# Patient Record
Sex: Female | Born: 1995 | Race: White | Hispanic: No | Marital: Married | Smoking: Never smoker
Health system: Southern US, Community
[De-identification: ages and names within clinical notes are randomized; demographics above are authoritative.]

## PROBLEM LIST (undated history)

## (undated) DIAGNOSIS — N83201 Unspecified ovarian cyst, right side: Secondary | ICD-10-CM

## (undated) DIAGNOSIS — F32A Depression, unspecified: Secondary | ICD-10-CM

## (undated) DIAGNOSIS — J45909 Unspecified asthma, uncomplicated: Secondary | ICD-10-CM

## (undated) DIAGNOSIS — N390 Urinary tract infection, site not specified: Secondary | ICD-10-CM

## (undated) DIAGNOSIS — F419 Anxiety disorder, unspecified: Secondary | ICD-10-CM

## (undated) DIAGNOSIS — F410 Panic disorder [episodic paroxysmal anxiety] without agoraphobia: Secondary | ICD-10-CM

## (undated) DIAGNOSIS — O139 Gestational [pregnancy-induced] hypertension without significant proteinuria, unspecified trimester: Secondary | ICD-10-CM

## (undated) HISTORY — PX: TONSILLECTOMY: SUR1361

## (undated) HISTORY — DX: Unspecified asthma, uncomplicated: J45.909

## (undated) HISTORY — DX: Panic disorder (episodic paroxysmal anxiety): F41.0

## (undated) HISTORY — DX: Anxiety disorder, unspecified: F41.9

## (undated) HISTORY — DX: Gestational (pregnancy-induced) hypertension without significant proteinuria, unspecified trimester: O13.9

---
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
## 2013-01-03 ENCOUNTER — Ambulatory Visit (INDEPENDENT_AMBULATORY_CARE_PROVIDER_SITE_OTHER): Payer: BC Managed Care – PPO | Admitting: Psychiatry

## 2013-01-03 DIAGNOSIS — R4589 Other symptoms and signs involving emotional state: Secondary | ICD-10-CM

## 2013-01-03 DIAGNOSIS — F410 Panic disorder [episodic paroxysmal anxiety] without agoraphobia: Secondary | ICD-10-CM

## 2013-01-04 ENCOUNTER — Encounter (HOSPITAL_COMMUNITY): Payer: Self-pay | Admitting: Psychiatry

## 2013-01-04 NOTE — Progress Notes (Signed)
   THERAPIST PROGRESS NOTE  Session Time: 1:30-2:20  Participation Level: Active  Behavioral Response: CasualAlertEuthymic  Type of Therapy: Individual Therapy  Treatment Goals addressed: Anxiety, stress management, emotion regulation  Interventions: CBT  Summary: Damara Klunder is a 17 y.o. female who presents with anxiety.   Suicidal/Homicidal: Nowithout intent/plan  Therapist Response: Pt. Presents as restless, fidgety, intermittently tearful.  Pt. Reports experience on remeron is that her panic attacks are more manageable; she continues to experience panic attacks but with less severity such that they do not ruin her day and she is able to participate in planned activities. Pt. Reports that she has forgotten to take remeron a few times and notices that she feels better when she does not take the medication, experienced more motivation to get dressed, look for a job, and participate in the day. Session focused on feelings of body insecurity, lack of trust in relationship with boyfriend.  Plan: Return again in 2 weeks.  Diagnosis: Axis I: Anxiety Disorder NOS    Axis II: No diagnosis    Wynonia Musty 01/04/2013

## 2013-01-06 ENCOUNTER — Telehealth (HOSPITAL_COMMUNITY): Payer: Self-pay | Admitting: Psychiatry

## 2013-01-06 ENCOUNTER — Telehealth (HOSPITAL_COMMUNITY): Payer: Self-pay

## 2013-01-06 NOTE — Telephone Encounter (Signed)
Mother left VM:Pt having problems.Does not want to continue to take Remeron.Went up to 15 mg on 9/7 and it seems to be helping. Appt with Dr. Lucianne Muss next Tuesday. Contacted mother.She states Jlee is not staying full time at home, but when she was home last night, she reported not sleeping well previous night.Mother states she appears agitated and tearful at times. Mother concerned about what pt reports discussed in appt with therapist. Advised mother to speak with therapist to clarify. She states she will call her. Mother asked if there was a way to know if the medicine is helping. Mother states Sanjuana says that even though medicine is helping, she does not want to stay on it. Mother states pt has been on Remeron 4 weeks, 2 weeks at 15 mg. Advised mother that full effect of medication has not been reached at this time and that since pt reports some improvement, that is indication medicine is helping. Advised mother to encourage daughter to wait to stop medicine until she can discuss it with Dr. Lucianne Muss on Tuesday.Mother states she will do this and remind her it is helping.

## 2013-01-10 ENCOUNTER — Ambulatory Visit (INDEPENDENT_AMBULATORY_CARE_PROVIDER_SITE_OTHER): Payer: BC Managed Care – PPO | Admitting: Psychiatry

## 2013-01-10 ENCOUNTER — Telehealth (HOSPITAL_COMMUNITY): Payer: Self-pay | Admitting: Psychiatry

## 2013-01-10 ENCOUNTER — Encounter (HOSPITAL_COMMUNITY): Payer: Self-pay | Admitting: Psychiatry

## 2013-01-10 DIAGNOSIS — F329 Major depressive disorder, single episode, unspecified: Secondary | ICD-10-CM

## 2013-01-10 DIAGNOSIS — F341 Dysthymic disorder: Secondary | ICD-10-CM

## 2013-01-10 DIAGNOSIS — F410 Panic disorder [episodic paroxysmal anxiety] without agoraphobia: Secondary | ICD-10-CM

## 2013-01-10 NOTE — Progress Notes (Signed)
Patient ID: Tiffany Hester, female   DOB: [birth year], 17 y.o.   MRN: [id number]  Session Time: 1:30-2:20   Participation Level: Active   Behavioral Response: CasualAlertDepressed and restless   Type of Therapy: Individual Therapy   Treatment Goals addressed: Anxiety, stress management, emotion regulation   Interventions: CBT   Summary: Tiffany Hester is a 17 y.o. female who presents with anxiety.   Suicidal/Homicidal: Nowithout intent/plan   Therapist Response: Pt. Presents as restless, tearful, suicidal ideation. Pt. Stated "I want to kill myself". Pt. Reports that she had suicide plan with razor blades two days ago and that her boyfriend stopped her. Pt. Maintains focus on boyfriend's past infidelity and pornography addiction and belief that she can control his behavior and feelings for her by monitoring his instagram.  Pt. States that "he makes me feel dirty and I am not good enough" because he looks at other girls and has pictures of girls on his instagram. Pt. Continues to report belief that remeron is contributing to her anxiety. Pt. Refused to work on breathing as relaxation and emotion regulation strategy. Encouraged Pt. To limit her use of social media. Consulted with Dr. Lucianne Muss regarding contracting Pt.'s safety and informed Pt. That her mother mother would be called. I called Pt.'s mother at work and agreed on plan for Pt.'s boyfriend's father to meet her in the office and take her home. Pt. Stated plan to go home and work on job applications and return to appointment with Dr. Lucianne Muss on 9/30.  Plan: Return again in 2 weeks.   Diagnosis: Axis I: Depression with anxiety   Axis II: No diagnosis   Tiffany Hester  01/10/2013

## 2013-01-11 ENCOUNTER — Ambulatory Visit (INDEPENDENT_AMBULATORY_CARE_PROVIDER_SITE_OTHER): Payer: BC Managed Care – PPO | Admitting: Psychiatry

## 2013-01-11 VITALS — BP 113/68 | HR 68 | Ht 69.29 in | Wt 123.0 lb

## 2013-01-11 DIAGNOSIS — F410 Panic disorder [episodic paroxysmal anxiety] without agoraphobia: Secondary | ICD-10-CM

## 2013-01-11 DIAGNOSIS — F329 Major depressive disorder, single episode, unspecified: Secondary | ICD-10-CM

## 2013-01-11 MED ORDER — MIRTAZAPINE 15 MG PO TABS: ORAL_TABLET | ORAL | Status: DC

## 2013-01-11 MED ORDER — QUETIAPINE FUMARATE 25 MG PO TABS: ORAL_TABLET | ORAL | Status: DC

## 2013-01-13 ENCOUNTER — Encounter (HOSPITAL_COMMUNITY): Payer: Self-pay | Admitting: Psychiatry

## 2013-01-13 ENCOUNTER — Telehealth (HOSPITAL_COMMUNITY): Payer: Self-pay | Admitting: Psychiatry

## 2013-01-13 ENCOUNTER — Telehealth (HOSPITAL_COMMUNITY): Payer: Self-pay | Admitting: *Deleted

## 2013-01-13 NOTE — Progress Notes (Signed)
Patient ID: Porter Nakama, female   DOB: 02-Aug-1995, 17 y.o.   MRN: [id number]  Psychiatric Assessment Adult  Patient Identification:  Bijou Easler Date of Evaluation:  01/13/2013 Chief Complaint: I am having panic-like feelings and because of that I cannot attend college History of Chief Complaint:   Chief Complaint  Patient presents with  . Anxiety  . Depression  . Follow-up   Anxiety Symptoms include depressed mood, excessive worry and nervous/anxious behavior. Patient reports no confusion, decreased concentration or suicidal ideas.     Patient is an 17 year old female diagnosed with major depressive disorder and generalized anxiety disorder who presents today for a followup visit. Patient reports that the depression has worsened on the Remeron as she's very tired, feels overwhelmed. She does acknowledge that she has some stresses which is a relationship with her boyfriend. She feels that patient is less type, mood would be better. She adds that even though she had suicidal thoughts, she is no plans of hurting herself or killing herself. She reports that she's not with mom since her appointment with a therapist yesterday and is doing better today.On a scale of 0-10, with 0 being no symptoms and 10 being the worst, patient reports her depression as 7/10 and her anxiety is 5/10.   Mom adds that they're monitoring the patient closely, providing her support. She understands that patient wants independence but feels that the patient seems to get overwhelmed easily. She states that the family is going to vacation to [place] and that this will help improve patient's mood. She does want patient's medication to be changed at the Remeron seems to make patient much more tired, irritable and overwhelmed. Review of Systems  Constitutional: Negative.   HENT: Negative.   Eyes: Negative.   Respiratory: Negative.   Cardiovascular: Negative.   Gastrointestinal: Negative.   Endocrine: Negative.    Genitourinary: Negative.   Musculoskeletal: Negative.   Skin: Negative.   Allergic/Immunologic: Negative.   Neurological: Negative.   Hematological: Negative.   Psychiatric/Behavioral: Positive for sleep disturbance and dysphoric mood. Negative for suicidal ideas, hallucinations, behavioral problems, confusion, self-injury, decreased concentration and agitation. The patient is nervous/anxious. The patient is not hyperactive.    Physical Exam Blood pressure 113/68, pulse 68, height 5' 9.29" (1.76 m), weight 123 lb (55.792 kg).    Past Psychiatric History: Diagnosis: None  Hospitalizations: None  Outpatient care: none  Substance Abuse Care: none   Homicidal ideation/attempt: none  Suicidal Attempts: none  Violent Behaviors: none   Past Medical History:   Past Medical History  Diagnosis Date  . Anxiety   . Asthma   . Panic attack 12/11/2012    Allergies:  No Known Allergies Current Medications:  Current Outpatient Prescriptions  Medication Sig Dispense Refill  . JUNEL FE 1/20 1-20 MG-MCG tablet       . mirtazapine (REMERON) 15 MG tablet PO 1/2 QHS for 2 days  1 tablet  0  . QUEtiapine (SEROQUEL) 25 MG tablet 1 at bedtime starting October 2nd. For 1 week and then 2 QHS  60 tablet  1   No current facility-administered medications for this visit.     Family History:   Family History  Problem Relation Age of Onset  . ADD / ADHD Brother   . Alcohol abuse Paternal Uncle     Mental Status Examination/Evaluation: Objective:  Appearance: Casual  Eye Contact::  Fair  Speech:  Clear and Coherent and low in volume  Volume:  Normal  Mood:  Sad, anxious  Affect:  Congruent, Depressed and Tearful  Thought Process:  Goal Directed and Intact  Orientation:  Full (Time, Place, and Person)  Thought Content:  Rumination  Suicidal Thoughts:  Yes.  without intent/plan  Homicidal Thoughts:  No  Judgement:  Impaired  Insight:  Shallow  Psychomotor Activity:  Mannerisms  Akathisia:   NA  Handed:  Right  AIMS (if indicated):  N/A  Assets:  Desire for Improvement Housing Physical Health Social Support Transportation     Assessment:  Axis I: Major Depression, single episode, Panic Disorder and R/O Asperger's Disorder  AXIS I Major Depression, single episode and Panic Disorder  AXIS II Cluster C Traits  AXIS III Past Medical History  Diagnosis Date  . Anxiety   . Asthma   . Panic attack 12/11/2012     AXIS IV educational problems and problems related to social environment  AXIS V 51-60 moderate symptoms   Treatment Plan/Recommendations:  Plan of Care: To decrease Remeron to 15 mg half a pill for 2 days and then discontinue as it is making patient tired and overwhelmed To start Seroquel from October 2 at 25 mg one at bedtime for one week and then increase to 2 at bedtime. The risks and benefits were discussed with patient and mom and they were agreeable with this plan   Laboratory:   none at this time  Psychotherapy: To continue seeing Victorino Dike for individual counseling to help with anxiety and depression    Routine PRN Medications:  No  Consultations:  Patient has an appointment at [facility] to have an evaluation.  Safety Concerns:  None at this as patient  Reports at times having some suicidal thoughts but denies having any plans or any intentions of killing herself   Other:  Call as necessary Follow up in 4 weeks  This was a appointment with moderate complexity requiring input from the patient and mom, coming up with a safety plan, addressing medication side effects and discussing this other medication treatment options along with patient's struggle with transition into adulthood  Nelly Rout, MD 10/2/20142:12 PM

## 2013-01-13 NOTE — Telephone Encounter (Signed)
Mother requests call from therapist

## 2013-01-24 ENCOUNTER — Telehealth (HOSPITAL_COMMUNITY): Payer: Self-pay | Admitting: *Deleted

## 2013-01-24 NOTE — Telephone Encounter (Signed)
Mother called with concerns about Seroquel. Pt told her mother that she started having "muscle twitching". Mother states that she did not increase her dosage as prescribed due to the twitching but over the weekend and would be taking 1/2 dosage. Wants Dr Lucianne Muss to be aware and advise them on what do do.

## 2013-01-24 NOTE — Telephone Encounter (Signed)
Called mother back per Dr Lucianne Muss request, explained that Tiffany Hester should come by office to have nurse evaluate side effects/muscle twitching. Mother stated that it's more joint pain and sharp muscle pains. She states patient is not doing well on medication and that she (pt) does not like the Seroquel and has anxiety about it. She is asking if she should take half dosage or whole dosage. Pt continues to have panic attacks. Wondering if she may need to see the Dr.

## 2013-01-27 ENCOUNTER — Ambulatory Visit (INDEPENDENT_AMBULATORY_CARE_PROVIDER_SITE_OTHER): Payer: BC Managed Care – PPO | Admitting: Psychiatry

## 2013-01-27 ENCOUNTER — Other Ambulatory Visit (HOSPITAL_COMMUNITY): Payer: Self-pay | Admitting: Psychiatry

## 2013-01-27 ENCOUNTER — Encounter (HOSPITAL_COMMUNITY): Payer: Self-pay | Admitting: Psychiatry

## 2013-01-27 ENCOUNTER — Telehealth (HOSPITAL_COMMUNITY): Payer: Self-pay | Admitting: *Deleted

## 2013-01-27 DIAGNOSIS — F329 Major depressive disorder, single episode, unspecified: Secondary | ICD-10-CM

## 2013-01-27 DIAGNOSIS — G249 Dystonia, unspecified: Secondary | ICD-10-CM

## 2013-01-27 DIAGNOSIS — F341 Dysthymic disorder: Secondary | ICD-10-CM

## 2013-01-27 MED ORDER — BENZTROPINE MESYLATE 1 MG PO TABS: 1.0000 mg | ORAL_TABLET | Freq: Every day | ORAL | Status: DC

## 2013-01-27 NOTE — Telephone Encounter (Signed)
Added Cogentin 1 MG to help with EPS
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
## 2013-01-27 NOTE — Telephone Encounter (Signed)
[id number] having medication problems.Gave info to other nurse.Should be in chart.Wants to know what to do about it. Information in VM given to Dr. Lucianne Muss. Dr.Kumar states she will try to see pt this afternoon when pt is here for therapist appt.

## 2013-02-01 ENCOUNTER — Encounter (HOSPITAL_COMMUNITY): Payer: Self-pay | Admitting: Psychiatry

## 2013-02-01 ENCOUNTER — Ambulatory Visit (INDEPENDENT_AMBULATORY_CARE_PROVIDER_SITE_OTHER): Payer: BC Managed Care – PPO | Admitting: Psychiatry

## 2013-02-01 VITALS — BP 114/80 | Ht 69.0 in | Wt 116.2 lb

## 2013-02-01 DIAGNOSIS — F329 Major depressive disorder, single episode, unspecified: Secondary | ICD-10-CM

## 2013-02-01 DIAGNOSIS — G249 Dystonia, unspecified: Secondary | ICD-10-CM

## 2013-02-01 DIAGNOSIS — F410 Panic disorder [episodic paroxysmal anxiety] without agoraphobia: Secondary | ICD-10-CM

## 2013-02-01 MED ORDER — QUETIAPINE FUMARATE 100 MG PO TABS: 100.0000 mg | ORAL_TABLET | Freq: Every day | ORAL | Status: DC

## 2013-02-03 NOTE — Progress Notes (Signed)
Patient ID: Tiffany Hester, female   DOB: Feb 22, 1996, 17 y.o.   MRN: [id number] Medication management followup  [name] Date of Evaluation:  02/03/2013 Chief Complaint: I am doing much better with my mood and anxiety. The Seroquel has helped greatly  Anxiety Presents for follow-up visit. The problem has been gradually improving. Symptoms include excessive worry. Patient reports no chest pain, confusion, decreased concentration, depressed mood, hyperventilation, irritability, nervous/anxious behavior or suicidal ideas. Symptoms occur occasionally. The severity of symptoms is mild. The symptoms are aggravated by family issues and specific phobias. The quality of sleep is fair. Nighttime awakenings: none.   There are no known risk factors.  Her past medical history is significant for depression. The treatment provided significant relief. Compliance with prior treatments has been good. Compliance with medications is 76-100%.   Patient is regards to depression, on a scale of 0-10,with 0 being no symptoms and 10 being the worst, reports that her mood is currently a 3/10 and on the same scale her anxiety is a 4/10.  Patient reports that the Seroquel has helped her greatly both with anxiety and depression. She reports that she's sleeping better. She adds that she is okay with increasing the medication as she has found it to be helpful.  In regards to side effects, patient reports that she initially had some muscle twitching but with the Cogentin being added she's not had any problems. She denies any side effects at this visit, any thoughts of ending her life, hurting herself, any self mutilating behaviors, any other complaints.  Review of Systems  Constitutional: Negative.  Negative for irritability.  HENT: Negative.   Eyes: Negative.   Respiratory: Negative.   Cardiovascular: Negative.  Negative for chest pain.  Gastrointestinal: Negative.   Endocrine: Negative.    Genitourinary: Negative.   Musculoskeletal: Negative.   Skin: Negative.   Allergic/Immunologic: Negative.   Neurological: Negative.   Hematological: Negative.   Psychiatric/Behavioral: Negative for suicidal ideas, hallucinations, behavioral problems, confusion, sleep disturbance, self-injury, dysphoric mood, decreased concentration and agitation. The patient is not nervous/anxious and is not hyperactive.    Physical Exam Blood pressure 114/80, height 5\' 9"  (1.753 m), weight 116 lb 3.2 oz (52.708 kg).    Past Medical History:   Past Medical History  Diagnosis Date  . Anxiety   . Asthma   . Panic attack 12/11/2012    Allergies:  No Known Allergies Current Medications:  Current Outpatient Prescriptions  Medication Sig Dispense Refill  . benztropine (COGENTIN) 1 MG tablet Take 1 tablet (1 mg total) by mouth at bedtime.  30 tablet  2  . JUNEL FE 1/20 1-20 MG-MCG tablet       . QUEtiapine (SEROQUEL) 100 MG tablet Take 1 tablet (100 mg total) by mouth at bedtime.  30 tablet  2   No current facility-administered medications for this visit.     Family History:   Family History  Problem Relation Age of Onset  . ADD / ADHD Brother   . Alcohol abuse Paternal Uncle     Mental Status Examination/Evaluation: Objective:  Appearance: Casual  Eye Contact::  Fair  Speech:  Clear and Coherent  Volume:  Normal  Mood:  Sad, anxious  Affect:  Appropriate, Congruent and Full Range  Thought Process:  Goal Directed and Intact  Orientation:  Full (Time, Place, and Person)  Thought Content:  Rumination  Suicidal Thoughts:  No  Homicidal Thoughts:  No  Judgement:  Impaired  Insight:  Shallow  Psychomotor Activity:  Normal  Akathisia:  NA  Handed:  Right  AIMS (if indicated):  N/A  Assets:  Desire for Improvement Housing Physical Health Social Support Transportation     Assessment:  Axis I: Major Depression, single episode, Panic Disorder and R/O Asperger's Disorder  AXIS I  Major Depression, single episode and Panic Disorder  AXIS II Cluster C Traits  AXIS III Past Medical History  Diagnosis Date  . Anxiety   . Asthma   . Panic attack 12/11/2012     AXIS IV educational problems and problems related to social environment  AXIS V 61-70 mild symptoms   Treatment Plan/Recommendations:  Plan of Care: To increase Seroquel 100 mg at bedtime for depression and mood stabilization To continue Cogentin 1 mg daily for EPS  Laboratory:   none at this time  Psychotherapy: To continue seeing Victorino Dike for individual counseling to help with anxiety and depression    Routine PRN Medications:  No  Consultations:  Patient has an appointment at [facility] to have an evaluation.  Safety Concerns:  None at this as patient    Other:  Call as necessary Follow up in 4 weeks  This was a appointment of low complexity   Nelly Rout, MD 10/23/201410:14 AM

## 2013-02-10 ENCOUNTER — Ambulatory Visit (INDEPENDENT_AMBULATORY_CARE_PROVIDER_SITE_OTHER): Payer: BC Managed Care – PPO | Admitting: Psychiatry

## 2013-02-10 ENCOUNTER — Encounter (HOSPITAL_COMMUNITY): Payer: Self-pay | Admitting: Psychiatry

## 2013-02-10 DIAGNOSIS — F341 Dysthymic disorder: Secondary | ICD-10-CM

## 2013-02-10 DIAGNOSIS — F410 Panic disorder [episodic paroxysmal anxiety] without agoraphobia: Secondary | ICD-10-CM

## 2013-02-10 DIAGNOSIS — F329 Major depressive disorder, single episode, unspecified: Secondary | ICD-10-CM
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
## 2013-02-16 ENCOUNTER — Ambulatory Visit (INDEPENDENT_AMBULATORY_CARE_PROVIDER_SITE_OTHER): Payer: BC Managed Care – PPO | Admitting: Psychiatry

## 2013-02-16 DIAGNOSIS — F329 Major depressive disorder, single episode, unspecified: Secondary | ICD-10-CM

## 2013-02-16 DIAGNOSIS — F341 Dysthymic disorder: Secondary | ICD-10-CM
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
## 2013-02-22 ENCOUNTER — Telehealth (HOSPITAL_COMMUNITY): Payer: Self-pay | Admitting: *Deleted

## 2013-02-22 NOTE — Telephone Encounter (Signed)
Per Dr. Lucianne Muss, will need to be seen to change medication. Front Scientist, product/process development to arrange appt and contact mother.
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
## 2013-02-24 ENCOUNTER — Ambulatory Visit (INDEPENDENT_AMBULATORY_CARE_PROVIDER_SITE_OTHER): Payer: BC Managed Care – PPO | Admitting: Psychiatry

## 2013-02-24 ENCOUNTER — Encounter (HOSPITAL_COMMUNITY): Payer: Self-pay | Admitting: Psychiatry

## 2013-02-24 VITALS — BP 120/80 | HR 77 | Ht 69.0 in | Wt 118.9 lb

## 2013-02-24 DIAGNOSIS — F329 Major depressive disorder, single episode, unspecified: Secondary | ICD-10-CM

## 2013-02-24 DIAGNOSIS — F4310 Post-traumatic stress disorder, unspecified: Secondary | ICD-10-CM

## 2013-02-24 DIAGNOSIS — G249 Dystonia, unspecified: Secondary | ICD-10-CM

## 2013-02-24 MED ORDER — CITALOPRAM HYDROBROMIDE 10 MG PO TABS: 10.0000 mg | ORAL_TABLET | Freq: Every day | ORAL | Status: DC

## 2013-02-24 MED ORDER — HYDROXYZINE HCL 10 MG PO TABS: 10.0000 mg | ORAL_TABLET | Freq: Three times a day (TID) | ORAL | Status: DC | PRN

## 2013-02-24 MED ORDER — QUETIAPINE FUMARATE 100 MG PO TABS: 100.0000 mg | ORAL_TABLET | Freq: Every day | ORAL | Status: DC

## 2013-02-24 MED ORDER — BENZTROPINE MESYLATE 1 MG PO TABS: 1.0000 mg | ORAL_TABLET | Freq: Every day | ORAL | Status: DC
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
## 2013-03-03 ENCOUNTER — Ambulatory Visit (HOSPITAL_COMMUNITY): Payer: BC Managed Care – PPO | Admitting: Psychiatry

## 2013-03-08 ENCOUNTER — Encounter (HOSPITAL_COMMUNITY): Payer: Self-pay | Admitting: Psychiatry

## 2013-03-08 ENCOUNTER — Ambulatory Visit (INDEPENDENT_AMBULATORY_CARE_PROVIDER_SITE_OTHER): Payer: BC Managed Care – PPO | Admitting: Psychiatry

## 2013-03-08 DIAGNOSIS — F329 Major depressive disorder, single episode, unspecified: Secondary | ICD-10-CM
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
## 2013-03-15 ENCOUNTER — Ambulatory Visit (HOSPITAL_COMMUNITY): Payer: BC Managed Care – PPO | Admitting: Psychiatry

## 2013-03-22 ENCOUNTER — Ambulatory Visit (INDEPENDENT_AMBULATORY_CARE_PROVIDER_SITE_OTHER): Payer: BC Managed Care – PPO | Admitting: Psychiatry

## 2013-03-22 ENCOUNTER — Encounter (HOSPITAL_COMMUNITY): Payer: Self-pay | Admitting: Psychiatry

## 2013-03-22 DIAGNOSIS — F329 Major depressive disorder, single episode, unspecified: Secondary | ICD-10-CM
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
## 2013-03-29 ENCOUNTER — Encounter (HOSPITAL_COMMUNITY): Payer: Self-pay | Admitting: Psychiatry

## 2013-03-29 ENCOUNTER — Ambulatory Visit (INDEPENDENT_AMBULATORY_CARE_PROVIDER_SITE_OTHER): Payer: BC Managed Care – PPO | Admitting: Psychiatry

## 2013-03-29 VITALS — BP 122/84 | Ht 69.0 in | Wt 117.8 lb

## 2013-03-29 DIAGNOSIS — F4310 Post-traumatic stress disorder, unspecified: Secondary | ICD-10-CM

## 2013-03-29 DIAGNOSIS — G249 Dystonia, unspecified: Secondary | ICD-10-CM

## 2013-03-29 DIAGNOSIS — F329 Major depressive disorder, single episode, unspecified: Secondary | ICD-10-CM

## 2013-03-29 MED ORDER — CITALOPRAM HYDROBROMIDE 20 MG PO TABS: 20.0000 mg | ORAL_TABLET | Freq: Every day | ORAL | Status: DC

## 2013-03-29 MED ORDER — BENZTROPINE MESYLATE 1 MG PO TABS
1.0000 mg | ORAL_TABLET | Freq: Every day | ORAL | Status: DC
Start: 2013-03-29 — End: 2013-05-03

## 2013-03-29 MED ORDER — QUETIAPINE FUMARATE 100 MG PO TABS: 100.0000 mg | ORAL_TABLET | Freq: Every day | ORAL | Status: DC

## 2013-03-29 NOTE — Progress Notes (Signed)
Patient ID: Tiffany Hester, female   DOB: 1995/10/15, 17 y.o.   MRN: [id number] Medication management followup  [name] Date of Evaluation:  03/29/2013 Chief Complaint: I am doing much better with my mood and anxiety but I still have some anxiety  Anxiety Presents for follow-up visit. The problem has been gradually improving. Symptoms include nervous/anxious behavior. Patient reports no chest pain, compulsions, confusion, decreased concentration, depressed mood, dizziness, dry mouth, excessive worry, feeling of choking, hyperventilation, insomnia, irritability, muscle tension, nausea, palpitations, panic, restlessness, shortness of breath or suicidal ideas. Symptoms occur most days. The severity of symptoms is causing significant distress and mild. The symptoms are aggravated by family issues and specific phobias. The quality of sleep is fair. Nighttime awakenings: one to two, occasional.   There are no known risk factors. Her past medical history is significant for depression. Past treatments include lifestyle changes and counseling (CBT). The treatment provided moderate relief. Compliance with prior treatments has been good. Compliance with medications is 76-100%.   Patient is regards to depression, on a scale of 0-10,with 0 being no symptoms and 10 being the worst, reports that her mood is currently a 2/10 and on the same scale her anxiety is a 5/10.  Patient reports that she is also now looking for a job,is dropping a job application today. She adds that she gets anxious at times, but other than anxiety she is overall doing well.  In regards to side effects, patient reports that she is not having any. She denies any thoughts of ending her life, hurting herself, any self mutilating behaviors, any other complaints.  Review of Systems  Constitutional: Negative.  Negative for irritability.  HENT: Negative.   Eyes: Negative.   Respiratory: Negative.  Negative for  shortness of breath.   Cardiovascular: Negative.  Negative for chest pain and palpitations.  Gastrointestinal: Negative.  Negative for nausea.  Endocrine: Negative.   Genitourinary: Negative.   Musculoskeletal: Negative.   Skin: Negative.   Allergic/Immunologic: Negative.   Neurological: Negative.  Negative for dizziness.  Hematological: Negative.   Psychiatric/Behavioral: Negative for suicidal ideas, hallucinations, behavioral problems, confusion, sleep disturbance, self-injury, dysphoric mood, decreased concentration and agitation. The patient is nervous/anxious. The patient does not have insomnia and is not hyperactive.    Physical Exam Blood pressure 122/84, height 5\' 9"  (1.753 m), weight 117 lb 12.8 oz (53.434 kg).    Past Medical History:   Past Medical History  Diagnosis Date  . Anxiety   . Asthma   . Panic attack 12/11/2012    Allergies:  No Known Allergies Current Medications:  Current Outpatient Prescriptions  Medication Sig Dispense Refill  . benztropine (COGENTIN) 1 MG tablet Take 1 tablet (1 mg total) by mouth at bedtime.  30 tablet  2  . citalopram (CELEXA) 20 MG tablet Take 1 tablet (20 mg total) by mouth daily.  30 tablet  2  . hydrOXYzine (ATARAX/VISTARIL) 10 MG tablet Take 1 tablet (10 mg total) by mouth 3 (three) times daily as needed for anxiety.  90 tablet  1  . JUNEL FE 1/20 1-20 MG-MCG tablet       . QUEtiapine (SEROQUEL) 100 MG tablet Take 1 tablet (100 mg total) by mouth at bedtime.  30 tablet  2   No current facility-administered medications for this visit.     Family History:   Family History  Problem Relation Age of Onset  . ADD / ADHD Brother   . Alcohol abuse Paternal Uncle  Mental Status Examination/Evaluation: Objective:  Appearance: Casual  Eye Contact::  Fair  Speech:  Clear and Coherent  Volume:  Normal  Mood:  Sad, anxious  Affect:  Appropriate, Congruent and Full Range  Thought Process:  Goal Directed and Intact  Orientation:   Full (Time, Place, and Person)  Thought Content:  Rumination  Suicidal Thoughts:  No  Homicidal Thoughts:  No  Judgement:  Impaired  Insight:  Shallow  Psychomotor Activity:  Normal  Akathisia:  NA  Handed:  Right  AIMS (if indicated):  N/A  Assets:  Desire for Improvement Housing Physical Health Social Support Transportation     Assessment:  Axis I: Major Depression, single episode and Post Traumatic Stress Disorder  AXIS I Major Depression, single episode and Post Traumatic Stress Disorder  AXIS II Cluster C Traits  AXIS III Past Medical History  Diagnosis Date  . Anxiety   . Asthma   . Panic attack 12/11/2012     AXIS IV educational problems and problems related to social environment  AXIS V 51-60 moderate symptoms   Treatment Plan/Recommendations:  Plan of Care: Continue Seroquel 100 mg at bedtime for depression and mood stabilization Continue Cogentin 1 mg daily for EPS To  Increase Celexa to 20 mg daily to help with anxiety and depression.  Continue Vistaril 10 mg by mouth 3 times a day when necessary for anxiety/agitation.   Laboratory:   none at this time  Psychotherapy: To continue seeing Victorino Dike for individual counseling to help with anxiety and depression    Routine PRN Medications:  Yes,Vistaril 10 mg 3 times a day when necessary anxiety/agitation  Consultations:  Patient has an appointment at [facility] to have an evaluation.  Safety Concerns:  None at this as patient    Other:  Call as necessary Follow up in 4 weeks   Nelly Rout, MD 12/16/201411:33 AM

## 2013-03-30 ENCOUNTER — Encounter (HOSPITAL_COMMUNITY): Payer: Self-pay | Admitting: Psychiatry

## 2013-03-30 ENCOUNTER — Ambulatory Visit (INDEPENDENT_AMBULATORY_CARE_PROVIDER_SITE_OTHER): Payer: BC Managed Care – PPO | Admitting: Psychiatry

## 2013-03-30 DIAGNOSIS — F410 Panic disorder [episodic paroxysmal anxiety] without agoraphobia: Secondary | ICD-10-CM

## 2013-03-30 DIAGNOSIS — F329 Major depressive disorder, single episode, unspecified: Secondary | ICD-10-CM
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
## 2013-04-05 ENCOUNTER — Ambulatory Visit (HOSPITAL_COMMUNITY): Payer: BC Managed Care – PPO | Admitting: Psychiatry

## 2013-04-12 ENCOUNTER — Ambulatory Visit (INDEPENDENT_AMBULATORY_CARE_PROVIDER_SITE_OTHER): Payer: BC Managed Care – PPO | Admitting: Psychiatry

## 2013-04-12 DIAGNOSIS — F329 Major depressive disorder, single episode, unspecified: Secondary | ICD-10-CM
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
## 2013-04-19 ENCOUNTER — Ambulatory Visit (HOSPITAL_COMMUNITY): Payer: BC Managed Care – PPO | Admitting: Psychiatry

## 2013-04-24 ENCOUNTER — Other Ambulatory Visit (HOSPITAL_COMMUNITY): Payer: Self-pay | Admitting: Psychiatry

## 2013-04-26 ENCOUNTER — Ambulatory Visit (INDEPENDENT_AMBULATORY_CARE_PROVIDER_SITE_OTHER): Payer: BC Managed Care – PPO | Admitting: Psychiatry

## 2013-04-26 DIAGNOSIS — F3289 Other specified depressive episodes: Secondary | ICD-10-CM

## 2013-04-26 DIAGNOSIS — F329 Major depressive disorder, single episode, unspecified: Secondary | ICD-10-CM
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
## 2013-05-03 ENCOUNTER — Other Ambulatory Visit (HOSPITAL_COMMUNITY): Payer: Self-pay | Admitting: Psychiatry

## 2013-05-03 ENCOUNTER — Ambulatory Visit (INDEPENDENT_AMBULATORY_CARE_PROVIDER_SITE_OTHER): Payer: BC Managed Care – PPO | Admitting: Psychiatry

## 2013-05-03 ENCOUNTER — Telehealth (HOSPITAL_COMMUNITY): Payer: Self-pay | Admitting: *Deleted

## 2013-05-03 DIAGNOSIS — F4310 Post-traumatic stress disorder, unspecified: Secondary | ICD-10-CM

## 2013-05-03 DIAGNOSIS — F410 Panic disorder [episodic paroxysmal anxiety] without agoraphobia: Secondary | ICD-10-CM

## 2013-05-03 DIAGNOSIS — F329 Major depressive disorder, single episode, unspecified: Secondary | ICD-10-CM

## 2013-05-03 DIAGNOSIS — G249 Dystonia, unspecified: Secondary | ICD-10-CM

## 2013-05-03 MED ORDER — HYDROXYZINE HCL 25 MG PO TABS: 25.0000 mg | ORAL_TABLET | Freq: Three times a day (TID) | ORAL | Status: DC | PRN

## 2013-05-03 MED ORDER — QUETIAPINE FUMARATE 100 MG PO TABS
100.0000 mg | ORAL_TABLET | Freq: Every day | ORAL | Status: DC
Start: 2013-05-03 — End: 2013-07-07

## 2013-05-03 MED ORDER — BENZTROPINE MESYLATE 1 MG PO TABS: 1.0000 mg | ORAL_TABLET | Freq: Two times a day (BID) | ORAL | Status: DC

## 2013-05-03 MED ORDER — CITALOPRAM HYDROBROMIDE 20 MG PO TABS: 20.0000 mg | ORAL_TABLET | Freq: Every day | ORAL | Status: DC

## 2013-05-03 NOTE — Progress Notes (Signed)
Patient ID: Tiffany Hester, female   DOB: May 16, 1995, 18 y.o.   MRN: [id number]  Session Time: 12:30-1:20   Participation Level: Active   Behavioral Response: CasualAlertDysphoric   Type of Therapy: Individual Therapy   Treatment Goals addressed: emotion regulation   Interventions: CBT, bodyscan and breathwork   Summary: Tiffany Hester is a 18 y.o. female who presents with depression and anxiety.   Suicidal/Homicidal: Nowithout intent/plan   Therapist Response: Pt. Presents with dysphoric mood and anxiety. Pt. Reports that she has moved to [place] with her boyfriend and found an apartment. Pt. Planning to continue with healthcare providers in [place]. Pt. Reports that she did not take cogentin for 2-3 nights this week because prescription ran out. When she restarted cogentin muscles in hands severely constricted and experienced difficulty breathing.  Pt. Reports for the first time that her medications affect her short-term memory and that she has nausea daily. Pt. Reports that she has lost approximately 6 pounds and has poor appetite. Pt. Reports that she is using marijuana daily to assist with relaxation at bedtime and to lessen nausea. Session focused on identifying recent stressors (i.e., moving, concerns about finding a job, fears about insufficient money and food). Pt. Participated in [place] and bodyscan. Recommended that Pt. Use body/scan and/or meditation and breathwork daily to manage stress and assist with management of anxiety and depression.  Plan: Return again in 1-2  weeks. Continue with CBT and interventions designed to assist with self-esteem development and emotion regulation (i.e., breathwork, meditation, and guided visualization).   Diagnosis: Axis I: Depressive Disorder NOS   Axis II: No diagnosis   Tiffany Hester  05/03/2013

## 2013-05-03 NOTE — Telephone Encounter (Signed)
Mother called left message about daughter having trouble walking, arms pulled (contracted) at times, having SOB needing to use her inhaler, stiff neck. Mother states she is on Seroquel and started Celexa in December. She has been nauseated and vomiting daily. Was out of Cogentin for several days but restarted on Sunday 05/01/13. Did not go to ER to be seen. Asked mother about fever or vomiting today and mother does not know as she has not talked to daughter today. She admits her daughter gets really hot when vomiting but hasn't had a fever. Patient has appointment at 12:30 today with Eloise Levels.

## 2013-05-09 ENCOUNTER — Telehealth (HOSPITAL_COMMUNITY): Payer: Self-pay

## 2013-05-09 ENCOUNTER — Ambulatory Visit (HOSPITAL_COMMUNITY): Payer: BC Managed Care – PPO | Admitting: Psychiatry

## 2013-05-10 ENCOUNTER — Ambulatory Visit (HOSPITAL_COMMUNITY): Payer: BC Managed Care – PPO | Admitting: Psychiatry

## 2013-05-18 ENCOUNTER — Telehealth (HOSPITAL_COMMUNITY): Payer: Self-pay | Admitting: *Deleted

## 2013-05-18 NOTE — Telephone Encounter (Signed)
Opened in error

## 2013-05-19 ENCOUNTER — Ambulatory Visit (HOSPITAL_COMMUNITY): Payer: BC Managed Care – PPO | Admitting: Psychiatry

## 2013-05-25 ENCOUNTER — Ambulatory Visit (HOSPITAL_COMMUNITY): Payer: BC Managed Care – PPO | Admitting: Psychiatry

## 2013-07-07 ENCOUNTER — Telehealth (HOSPITAL_COMMUNITY): Payer: Self-pay | Admitting: *Deleted

## 2013-07-07 DIAGNOSIS — F329 Major depressive disorder, single episode, unspecified: Secondary | ICD-10-CM

## 2013-07-07 MED ORDER — QUETIAPINE FUMARATE 100 MG PO TABS: 100.0000 mg | ORAL_TABLET | Freq: Every day | ORAL | Status: DC

## 2013-07-07 NOTE — Telephone Encounter (Signed)
Mother left FI:EPPI 3/31.Will be out of Seroquel 2 days before then.[facility] [place] [place], [phone]. Contacted mother--Advised mother RX for 30 day supply with 2 refills sent to [facility] 05/03/13,should have one last refill.Mother states she talked to them and they said they do not have any more refills.[facility] [place].

## 2013-07-12 ENCOUNTER — Encounter (HOSPITAL_COMMUNITY): Payer: Self-pay | Admitting: Psychiatry

## 2013-07-12 ENCOUNTER — Ambulatory Visit (INDEPENDENT_AMBULATORY_CARE_PROVIDER_SITE_OTHER): Payer: BC Managed Care – PPO | Admitting: Psychiatry

## 2013-07-12 VITALS — BP 122/74 | Ht 69.0 in | Wt 114.6 lb

## 2013-07-12 DIAGNOSIS — F329 Major depressive disorder, single episode, unspecified: Secondary | ICD-10-CM

## 2013-07-12 DIAGNOSIS — F4310 Post-traumatic stress disorder, unspecified: Secondary | ICD-10-CM

## 2013-07-12 MED ORDER — CITALOPRAM HYDROBROMIDE 40 MG PO TABS: 40.0000 mg | ORAL_TABLET | Freq: Every day | ORAL | Status: DC

## 2013-07-12 MED ORDER — HYDROXYZINE HCL 25 MG PO TABS: 25.0000 mg | ORAL_TABLET | Freq: Three times a day (TID) | ORAL | Status: DC | PRN

## 2013-07-12 MED ORDER — QUETIAPINE FUMARATE 100 MG PO TABS: 100.0000 mg | ORAL_TABLET | Freq: Every day | ORAL | Status: DC
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
## 2013-08-05 ENCOUNTER — Other Ambulatory Visit (HOSPITAL_COMMUNITY): Payer: Self-pay | Admitting: Psychiatry

## 2013-08-05 DIAGNOSIS — F329 Major depressive disorder, single episode, unspecified: Secondary | ICD-10-CM

## 2013-09-01 ENCOUNTER — Ambulatory Visit (HOSPITAL_COMMUNITY): Payer: BC Managed Care – PPO | Admitting: Psychiatry

## 2013-09-08 ENCOUNTER — Ambulatory Visit (INDEPENDENT_AMBULATORY_CARE_PROVIDER_SITE_OTHER): Payer: BC Managed Care – PPO | Admitting: Psychiatry

## 2013-09-08 ENCOUNTER — Encounter (HOSPITAL_COMMUNITY): Payer: Self-pay | Admitting: Psychiatry

## 2013-09-08 VITALS — BP 122/78 | Ht 69.0 in | Wt 116.0 lb

## 2013-09-08 DIAGNOSIS — F4310 Post-traumatic stress disorder, unspecified: Secondary | ICD-10-CM

## 2013-09-08 DIAGNOSIS — F329 Major depressive disorder, single episode, unspecified: Secondary | ICD-10-CM

## 2013-09-08 MED ORDER — CITALOPRAM HYDROBROMIDE 40 MG PO TABS: 40.0000 mg | ORAL_TABLET | Freq: Every day | ORAL | Status: DC

## 2013-09-08 MED ORDER — HYDROXYZINE HCL 25 MG PO TABS: 25.0000 mg | ORAL_TABLET | Freq: Three times a day (TID) | ORAL | Status: DC | PRN

## 2013-09-08 MED ORDER — QUETIAPINE FUMARATE 50 MG PO TABS: 50.0000 mg | ORAL_TABLET | Freq: Every day | ORAL | Status: DC
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
## 2013-09-09 ENCOUNTER — Ambulatory Visit (INDEPENDENT_AMBULATORY_CARE_PROVIDER_SITE_OTHER): Payer: BC Managed Care – PPO | Admitting: Psychiatry

## 2013-09-09 DIAGNOSIS — F329 Major depressive disorder, single episode, unspecified: Secondary | ICD-10-CM

## 2013-09-09 NOTE — Progress Notes (Signed)
   THERAPIST PROGRESS NOTE  Session Time: 1:00-2:00  Participation Level: Active   Behavioral Response: CasualAlertEuthymic  Type of Therapy: Individual Therapy   Treatment Goals addressed: emotion regulation   Interventions: CBT, supportive, strengthbased  Summary: Chihiro Frey is a 18 y.o. female who presents with depression and anxiety.   Suicidal/Homicidal: Nowithout intent/plan   Therapist Response: Pt. Presents with euthymic mood, makes eye contact, smiles and laughs appropriately. Pt. Reports that she has moved to [place] with her boyfriend. Pt. 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 Pt. Reports that she and boyfriend are doing well, likes her apartment, and has two cats Pt. Reports that her depression and anxiety have been manageable although she continues to have periods when she feels "skeptical" about her boyfriends love for her and is fearful that he might cheat on her. Pt. Was encouraged to find exceptions to her thoughts about her boyfriend's behavior i.e., he is with me now, he is not cheating on me now, right now I feel loved and protected. Pt. Shared her artwork. Session focused on challenging cognitive distortions about her relationship, finding ways to self-soothe when anxious (i.e., blanket with mother's perfume to wrap around her), and focusing on her sketches.  Plan: Return again in 1-2 weeks. Continue with CBT and interventions designed to assist with self-esteem development and emotion regulation (i.e., breathwork, meditation, and guided visualization).   Diagnosis: Axis I: Depressive Disorder NOS   Axis II: No diagnosis     Renford Dills 09/09/2013

## 2013-09-16 ENCOUNTER — Telehealth (HOSPITAL_COMMUNITY): Payer: Self-pay

## 2013-09-19 NOTE — Telephone Encounter (Signed)
Can increase back to seroquel to 100 MG

## 2013-10-01 ENCOUNTER — Other Ambulatory Visit (HOSPITAL_COMMUNITY): Payer: Self-pay | Admitting: Psychiatry

## 2013-10-07 ENCOUNTER — Ambulatory Visit (HOSPITAL_COMMUNITY): Payer: BC Managed Care – PPO | Admitting: Psychiatry

## 2013-10-18 ENCOUNTER — Ambulatory Visit (HOSPITAL_COMMUNITY): Payer: BC Managed Care – PPO | Admitting: Psychiatry

## 2013-10-18 ENCOUNTER — Encounter (HOSPITAL_COMMUNITY): Payer: Self-pay | Admitting: Psychiatry

## 2013-10-18 ENCOUNTER — Ambulatory Visit (INDEPENDENT_AMBULATORY_CARE_PROVIDER_SITE_OTHER): Payer: BC Managed Care – PPO | Admitting: Psychiatry

## 2013-10-18 VITALS — BP 120/68 | Ht 69.0 in | Wt 116.6 lb

## 2013-10-18 DIAGNOSIS — F329 Major depressive disorder, single episode, unspecified: Secondary | ICD-10-CM

## 2013-10-18 DIAGNOSIS — F4310 Post-traumatic stress disorder, unspecified: Secondary | ICD-10-CM

## 2013-10-18 DIAGNOSIS — F321 Major depressive disorder, single episode, moderate: Secondary | ICD-10-CM

## 2013-10-18 MED ORDER — CITALOPRAM HYDROBROMIDE 40 MG PO TABS: 40.0000 mg | ORAL_TABLET | Freq: Every day | ORAL | Status: DC

## 2013-10-18 MED ORDER — QUETIAPINE FUMARATE 100 MG PO TABS: 100.0000 mg | ORAL_TABLET | Freq: Every day | ORAL | Status: DC

## 2013-10-18 NOTE — Progress Notes (Signed)
Patient ID: Tiffany Hester, female   DOB: 1995/08/18, 18 y.o.   MRN: [id number] Medication management followup  [name] Date of Evaluation:  10/18/2013 Chief Complaint: I still struggle with my mood and anxiety at times especially when I'm overwhelmed. I quit my job as I felt my boss was rude and that stressed me   History of present illness: Patient is a 18 year old female diagnosed with major depressive disorder and PTSD who presents today for a followup visit.  Patient states that she quit her job as it was too stressful for her, adds that she has an interview for another job on Monday and has put an other applications. She states that she found a job overwhelming as her boss would yell at times. She adds that because of that she was having suicidal thoughts but since she's quit her job she is doing better. She states that her mood is stable at this time as there is no stressor. She agrees with mom that she does better when she sees a therapist and adds that she will open her therapist in [place] a long drive to come to [place] to see a therapist on a weekly basis.  On a scale of 0-10 with 0 being no symptoms in 10 being the worst, patient reports her depression a 2/10 and her anxiety on the same scale as a 4/10. She states that the Seroquel makes her tired but mom reports that when the Seroquel was lowered, she was struggling with her mood. Patient agrees with this. Patient states that part of the problem is that she's not really motivated to get out and do anything as she's currently does not have a job. Mom agrees that the patient's motivation was better when she was working. They both deny any aggravating or relieving factors at this visit  Patient denies any other side effects. She denies any thoughts of ending her life, hurting herself, any self mutilating behaviors, any other complaints.  Review of Systems  Constitutional: Negative.  Negative for fever  and malaise/fatigue.  HENT: Negative.  Negative for congestion and sore throat.   Eyes: Negative.  Negative for blurred vision.  Respiratory: Negative.  Negative for cough and wheezing.   Cardiovascular: Negative.  Negative for palpitations.  Gastrointestinal: Negative for heartburn, nausea, vomiting, diarrhea and constipation.  Genitourinary: Negative.  Negative for dysuria.  Musculoskeletal: Negative.  Negative for myalgias.  Skin: Negative.   Neurological: Negative.  Negative for dizziness, seizures, loss of consciousness, weakness and headaches.  Endo/Heme/Allergies: Negative.  Negative for environmental allergies.  Psychiatric/Behavioral: Negative for depression, suicidal ideas, hallucinations, memory loss and substance abuse. The patient is nervous/anxious. The patient does not have insomnia.     Physical Exam Blood pressure 120/68, height 5\' 9"  (1.753 m), weight 116 lb 9.6 oz (52.889 kg).    Past Medical History:   Past Medical History  Diagnosis Date  . Anxiety   . Asthma   . Panic attack 12/11/2012    Allergies:  No Known Allergies Current Medications:  Current Outpatient Prescriptions  Medication Sig Dispense Refill  . benztropine (COGENTIN) 1 MG tablet TAKE 1 TABLET BY MOUTH TWICE A DAY  60 tablet  1  . citalopram (CELEXA) 40 MG tablet Take 1 tablet (40 mg total) by mouth daily.  30 tablet  2  . hydrOXYzine (ATARAX/VISTARIL) 25 MG tablet Take 1 tablet (25 mg total) by mouth 3 (three) times daily as needed for anxiety.  90 tablet  2  .  JUNEL FE 1/20 1-20 MG-MCG tablet       . QUEtiapine (SEROQUEL) 100 MG tablet Take 1 tablet (100 mg total) by mouth at bedtime.  30 tablet  2   No current facility-administered medications for this visit.     Family History:   Family History  Problem Relation Age of Onset  . ADD / ADHD Brother   . Alcohol abuse Paternal Uncle     Mental Status Examination/Evaluation: Objective:  Appearance: Casual  Eye Contact::  Fair  Speech:   Clear and Coherent  Volume:  Normal  Mood:  Euthymic  Affect:  Appropriate, Congruent and Full Range  Thought Process:  Goal Directed and Intact  Orientation:  Full (Time, Place, and Person)  Thought Content:  Rumination  Suicidal Thoughts:  No  Homicidal Thoughts:  No  Judgement:  Impaired  Insight:  Shallow  Psychomotor Activity:  Normal  Akathisia:  NA  Handed:  Right  AIMS (if indicated):  N/A  Assets:  Desire for Improvement Housing Physical Health Social Set designer and fund of knowledge: good     Assessment:  Axis I: Major Depression, single episode and Post Traumatic Stress Disorder  AXIS I Major Depression, single episode and Post Traumatic Stress Disorder  AXIS II Cluster C Traits  AXIS III Past Medical History  Diagnosis Date  . Anxiety   . Asthma   . Panic attack 12/11/2012     AXIS IV educational problems and problems related to social environment  AXIS V 61-70 mild symptoms   Treatment Plan/Recommendations:  Plan of Care: Continue Seroquel 100 mg at bedtime for mood stabilization Continue Cogentin 1 mg daily for EPS Continue  Celexa 40 mg daily to help with anxiety and depression.  Continue Vistaril 25 mg by mouth 3 times a day when necessary for anxiety/agitation.   Laboratory:   none at this time  Psychotherapy: Discussed with patient that she needs to see a therapist on a weekly basis and as it's hard for her to drive down to [facility], she would benefit from seeing a therapist in [place]    Routine PRN Medications:  Yes,Vistaril 25 mg 3 times a day when necessary anxiety/agitation    Safety Concerns:  None at this as patient    Other:  Call as necessary Follow up in 6 to 8 weeks  50% of this visit was spent in discussing coping skills, ways of managing anxiety, ways of identifies stressors, side effects of medications, risks and benefits of medications. Also discussed with patient that she needs to have a job, do something she  really enjoys as she struggles with her mood if her job is overwhelming This visit was of moderate complexity and exceeded 25 minutes Hampton Abbot, MD 7/7/20152:13 PM

## 2013-10-24 ENCOUNTER — Other Ambulatory Visit (HOSPITAL_COMMUNITY): Payer: Self-pay | Admitting: Psychiatry

## 2013-10-24 DIAGNOSIS — F321 Major depressive disorder, single episode, moderate: Secondary | ICD-10-CM

## 2013-10-24 MED ORDER — QUETIAPINE FUMARATE 100 MG PO TABS: 100.0000 mg | ORAL_TABLET | Freq: Every day | ORAL | Status: DC

## 2013-10-25 ENCOUNTER — Encounter (HOSPITAL_COMMUNITY): Payer: Self-pay | Admitting: *Deleted

## 2013-10-25 NOTE — Progress Notes (Signed)
Seroquel 100 mg daily authorized by [employer] # [id number], effective 09/25/13 thru 10/25/14 Notified mother and pharmacy by phone

## 2013-11-11 ENCOUNTER — Ambulatory Visit (HOSPITAL_COMMUNITY): Payer: BC Managed Care – PPO | Admitting: Psychiatry

## 2013-11-25 ENCOUNTER — Telehealth (HOSPITAL_COMMUNITY): Payer: Self-pay

## 2013-12-02 ENCOUNTER — Ambulatory Visit (HOSPITAL_COMMUNITY): Payer: BC Managed Care – PPO | Admitting: Psychiatry

## 2013-12-08 ENCOUNTER — Ambulatory Visit (INDEPENDENT_AMBULATORY_CARE_PROVIDER_SITE_OTHER): Payer: BC Managed Care – PPO | Admitting: Psychiatry

## 2013-12-08 ENCOUNTER — Encounter (HOSPITAL_COMMUNITY): Payer: Self-pay | Admitting: Psychiatry

## 2013-12-08 VITALS — BP 115/74 | HR 67 | Ht 69.0 in | Wt 114.0 lb

## 2013-12-08 DIAGNOSIS — F329 Major depressive disorder, single episode, unspecified: Secondary | ICD-10-CM

## 2013-12-08 DIAGNOSIS — F321 Major depressive disorder, single episode, moderate: Secondary | ICD-10-CM

## 2013-12-08 DIAGNOSIS — F4310 Post-traumatic stress disorder, unspecified: Secondary | ICD-10-CM

## 2013-12-08 MED ORDER — QUETIAPINE FUMARATE 100 MG PO TABS: 100.0000 mg | ORAL_TABLET | Freq: Every day | ORAL | Status: DC

## 2013-12-08 MED ORDER — CITALOPRAM HYDROBROMIDE 40 MG PO TABS: 40.0000 mg | ORAL_TABLET | Freq: Every day | ORAL | Status: DC

## 2013-12-08 NOTE — Progress Notes (Signed)
Patient ID: Tiffany Hester, female   DOB: Sep 17, 1995, 18 y.o.   MRN: [id number] Medication management followup  [name] Date of Evaluation:  12/08/2013 Chief Complaint: I Am doing much better with my mood, I still feel tired at times   History of present illness: Patient is a 18 year old female diagnosed with major depressive disorder and PTSD who presents today for a followup visit.  Patient states that she has found a new job, has been working there for 6 weeks and really enjoys it. She has that she's happy and her mood has improved significantly.On a scale of 0-10 with 0 being no symptoms in 10 being the worst, patient reports her depression a 1/10 and her anxiety on the same scale as a 2/10. She states that she still feels tired some mornings but adds that she does not want her medications changed that she's doing fairly well on them. Mom agrees with the patient. Mom feels that patient needs to start working out and that should help with the fatigue. Patient's agreeable with this plan. They both deny any aggravating or relieving factors at this visit  Patient denies any other side effects. She denies any thoughts of hurting herself or others,any self mutilating behaviors, any other complaints.  Review of Systems  Constitutional: Negative.  Negative for fever and malaise/fatigue.  HENT: Negative.  Negative for congestion and sore throat.   Eyes: Negative.  Negative for blurred vision.  Respiratory: Negative.  Negative for cough and wheezing.   Cardiovascular: Negative.  Negative for palpitations.  Gastrointestinal: Negative.  Negative for heartburn, nausea, vomiting, diarrhea and constipation.  Genitourinary: Negative.  Negative for dysuria.  Musculoskeletal: Negative.  Negative for myalgias.  Skin: Negative.   Neurological: Negative.  Negative for dizziness, seizures, loss of consciousness, weakness and headaches.  Endo/Heme/Allergies: Negative.  Negative  for environmental allergies.  Psychiatric/Behavioral: Negative.  Negative for depression, suicidal ideas, hallucinations, memory loss and substance abuse. The patient is not nervous/anxious and does not have insomnia.     Physical Exam Blood pressure 115/74, pulse 67, height 5\' 9"  (1.753 m), weight 114 lb (51.71 kg).    Past Medical History:   Past Medical History  Diagnosis Date  . Anxiety   . Asthma   . Panic attack 12/11/2012    Allergies:  No Known Allergies Current Medications:  Current Outpatient Prescriptions  Medication Sig Dispense Refill  . benztropine (COGENTIN) 1 MG tablet TAKE 1 TABLET BY MOUTH TWICE A DAY  60 tablet  1  . citalopram (CELEXA) 40 MG tablet Take 1 tablet (40 mg total) by mouth daily.  30 tablet  2  . hydrOXYzine (ATARAX/VISTARIL) 25 MG tablet Take 1 tablet (25 mg total) by mouth 3 (three) times daily as needed for anxiety.  90 tablet  2  . JUNEL FE 1/20 1-20 MG-MCG tablet       . QUEtiapine (SEROQUEL) 100 MG tablet Take 1 tablet (100 mg total) by mouth at bedtime.  30 tablet  2   No current facility-administered medications for this visit.     Family History:   Family History  Problem Relation Age of Onset  . ADD / ADHD Brother   . Alcohol abuse Paternal Uncle     Mental Status Examination/Evaluation: Objective:  Appearance: Casual  Eye Contact::  Fair  Speech:  Clear and Coherent  Volume:  Normal  Mood:  Euthymic  Affect:  Appropriate, Congruent and Full Range  Thought Process:  Goal Directed and Intact  Orientation:  Full (Time, Place, and Person)  Thought Content:  WDL  Suicidal Thoughts:  No  Homicidal Thoughts:  No  Judgement:  Impaired  Insight:  Shallow  Psychomotor Activity:  Normal  Akathisia:  NA  Handed:  Right  AIMS (if indicated):  N/A  Assets:  Desire for [place] Set designer and fund of knowledge: good     Assessment:  Axis I: Major Depression, single episode  and Post Traumatic Stress Disorder  AXIS I Major Depression, single episode and Post Traumatic Stress Disorder  AXIS II Cluster C Traits  AXIS III Past Medical History  Diagnosis Date  . Anxiety   . Asthma   . Panic attack 12/11/2012     AXIS IV educational problems and problems related to social environment  AXIS V 61-70 mild symptoms   Treatment Plan/Recommendations:  Plan of Care: Continue Seroquel 100 mg at bedtime for mood stabilization Continue Cogentin 1 mg daily for EPS Continue  Celexa 40 mg daily to help with anxiety and depression.  Continue Vistaril 25 mg by mouth 3 times a day when necessary for anxiety/agitation.   Laboratory:   none at this time  Psychotherapy: Discussed with patient that she needs to see a therapist on a weekly basis and as it's hard for her to drive down to [facility], she would benefit from seeing a therapist in [place]    Routine PRN Medications:  Yes,Vistaril 25 mg 3 times a day when necessary anxiety/agitation    Safety Concerns:  None at this as patient    Other:  Call as necessary Follow up in 3 months  50% of this visit was spent in discussing coping skills, the need for patient that he see a therapist there. Also discussed with patient the need to stop adopting as patient already has 3 cats Tiffany Abbot, MD 8/29/201512:07 PM

## 2014-01-01 ENCOUNTER — Other Ambulatory Visit (HOSPITAL_COMMUNITY): Payer: Self-pay | Admitting: Psychiatry

## 2014-02-15 ENCOUNTER — Telehealth (HOSPITAL_COMMUNITY): Payer: Self-pay

## 2014-02-16 NOTE — Telephone Encounter (Signed)
Left message for mom to call back monday

## 2014-02-22 ENCOUNTER — Telehealth (HOSPITAL_COMMUNITY): Payer: Self-pay | Admitting: *Deleted

## 2014-02-22 NOTE — Telephone Encounter (Signed)
Mother left LF:[id number] will not be able to keep appt next Wednesday due to starting new full time job. Found out that next appointments are after first of year. How will this affect her medication?  Phoned mother @ [phone] - No VM set up on number - could not leave message

## 2014-02-23 ENCOUNTER — Other Ambulatory Visit (HOSPITAL_COMMUNITY): Payer: Self-pay | Admitting: Psychiatry

## 2014-02-23 DIAGNOSIS — F321 Major depressive disorder, single episode, moderate: Secondary | ICD-10-CM

## 2014-02-23 DIAGNOSIS — F4310 Post-traumatic stress disorder, unspecified: Secondary | ICD-10-CM

## 2014-02-23 MED ORDER — HYDROXYZINE HCL 25 MG PO TABS: 25.0000 mg | ORAL_TABLET | Freq: Three times a day (TID) | ORAL | Status: DC | PRN

## 2014-02-23 MED ORDER — QUETIAPINE FUMARATE 100 MG PO TABS: 100.0000 mg | ORAL_TABLET | Freq: Every day | ORAL | Status: DC

## 2014-02-23 MED ORDER — BENZTROPINE MESYLATE 1 MG PO TABS: ORAL_TABLET | ORAL | Status: DC

## 2014-02-23 MED ORDER — CITALOPRAM HYDROBROMIDE 40 MG PO TABS: 40.0000 mg | ORAL_TABLET | Freq: Every day | ORAL | Status: DC

## 2014-02-23 NOTE — Telephone Encounter (Signed)
Called mom and informed her that we will continue medications

## 2014-02-26 ENCOUNTER — Other Ambulatory Visit (HOSPITAL_COMMUNITY): Payer: Self-pay | Admitting: Psychiatry

## 2014-02-27 ENCOUNTER — Ambulatory Visit (HOSPITAL_COMMUNITY): Payer: BC Managed Care – PPO | Admitting: Psychiatry

## 2014-03-13 ENCOUNTER — Ambulatory Visit (HOSPITAL_COMMUNITY): Payer: BC Managed Care – PPO | Admitting: Psychiatry

## 2014-05-08 ENCOUNTER — Ambulatory Visit (HOSPITAL_COMMUNITY): Payer: BC Managed Care – PPO | Admitting: Psychiatry

## 2014-05-15 ENCOUNTER — Ambulatory Visit (INDEPENDENT_AMBULATORY_CARE_PROVIDER_SITE_OTHER): Payer: BLUE CROSS/BLUE SHIELD | Admitting: Psychiatry

## 2014-05-15 ENCOUNTER — Encounter (HOSPITAL_COMMUNITY): Payer: Self-pay | Admitting: Psychiatry

## 2014-05-15 VITALS — BP 133/68 | HR 77 | Ht 69.0 in | Wt 120.2 lb

## 2014-05-15 DIAGNOSIS — F329 Major depressive disorder, single episode, unspecified: Secondary | ICD-10-CM | POA: Diagnosis not present

## 2014-05-15 DIAGNOSIS — F321 Major depressive disorder, single episode, moderate: Secondary | ICD-10-CM

## 2014-05-15 DIAGNOSIS — F4310 Post-traumatic stress disorder, unspecified: Secondary | ICD-10-CM | POA: Diagnosis not present

## 2014-05-15 MED ORDER — BENZTROPINE MESYLATE 1 MG PO TABS: 1.0000 mg | ORAL_TABLET | Freq: Two times a day (BID) | ORAL | Status: DC

## 2014-05-15 MED ORDER — CITALOPRAM HYDROBROMIDE 40 MG PO TABS: 40.0000 mg | ORAL_TABLET | Freq: Every day | ORAL | Status: DC

## 2014-05-15 MED ORDER — HYDROXYZINE HCL 25 MG PO TABS: 25.0000 mg | ORAL_TABLET | Freq: Three times a day (TID) | ORAL | Status: DC | PRN

## 2014-05-15 MED ORDER — QUETIAPINE FUMARATE 50 MG PO TABS: 50.0000 mg | ORAL_TABLET | Freq: Every day | ORAL | Status: DC

## 2014-05-15 NOTE — Progress Notes (Signed)
Patient ID: Tiffany Hester, female   DOB: [birth year], 19 y.o.   MRN: [id number]  Medication management followup  [name] Date of Evaluation:  05/15/2014 Chief Complaint: I am doing OK but I have not still found a therapist   History of present illness: Patient is a 19 year old female diagnosed with major depressive disorder and PTSD who presents today for a followup visit.  Patient reports that she still feels tired on the Seroquel and would like to decrease the dosage. She has that her mood is stable, as she enjoys her job even though it can be stressful on times. On being asked to elaborate, patient states that sometimes is a lot of drama that she's able to handle it. On a scale of 0-10 with 0 being no symptoms in 10 being the worst, patient reports her depression a 1/10 and her anxiety on the same scale as a 2/10. She states that she still feels tired some mornings and has to drink a lot of caffeine to help with the fatigue. Mom agrees with the patient. Mom feels that patient. Patient also denies any self-medicating behaviors, any thoughts of hurting herself or others. She adds that she is also eating much better, has gained weight and is overall doing well. Mom agrees with the patient reports the patient seems to be happy.  Mom states that they're trying to find a psychiatrist and a therapist for patient in [place], she states that she's can recheck again with her insurance as it's hard for patient to keep her appointments here. Discussed this with patient and patient states that she feels she is stable enough to switch to a psychiatrist and a therapist in [place]. Mom adds that she will look at the list of providers through her insurance who are available in [place].  Patient denies any other side effects,  any other complaints.  Review of Systems  Constitutional: Positive for malaise/fatigue. Negative for fever and weight loss.  HENT: Negative.  Negative for congestion,  hearing loss and sore throat.   Eyes: Negative.  Negative for blurred vision, double vision, discharge and redness.  Respiratory: Negative.  Negative for cough and shortness of breath.   Cardiovascular: Negative.  Negative for chest pain and palpitations.  Gastrointestinal: Negative.  Negative for heartburn, nausea, vomiting and abdominal pain.  Genitourinary: Negative.  Negative for dysuria and urgency.  Musculoskeletal: Negative.  Negative for myalgias, joint pain and falls.  Skin: Negative.  Negative for itching and rash.  Neurological: Negative.  Negative for dizziness, tingling, tremors, sensory change, focal weakness, seizures, loss of consciousness, weakness and headaches.  Endo/Heme/Allergies: Negative.  Negative for environmental allergies. Does not bruise/bleed easily.  Psychiatric/Behavioral: Negative.  Negative for depression, suicidal ideas, hallucinations, memory loss and substance abuse. The patient is not nervous/anxious and does not have insomnia.     Physical Exam Blood pressure 133/68, pulse 77, height 5\' 9"  (1.753 m), weight 120 lb 3.2 oz (54.522 kg).    Past Medical History:   Past Medical History  Diagnosis Date  . Anxiety   . Asthma   . Panic attack 12/11/2012    Allergies:  No Known Allergies Current Medications:  Current Outpatient Prescriptions  Medication Sig Dispense Refill  . benztropine (COGENTIN) 1 MG tablet TAKE 1 TABLET BY MOUTH TWICE A DAY 60 tablet 2  . benztropine (COGENTIN) 1 MG tablet TAKE 1 TABLET BY MOUTH TWICE A DAY 60 tablet 1  . citalopram (CELEXA) 40 MG tablet Take 1 tablet (40  mg total) by mouth daily. 30 tablet 2  . citalopram (CELEXA) 40 MG tablet TAKE 1 TABLET BY MOUTH DAILY 30 tablet 2  . hydrOXYzine (ATARAX/VISTARIL) 25 MG tablet Take 1 tablet (25 mg total) by mouth 3 (three) times daily as needed for anxiety. 90 tablet 2  . JUNEL FE 1/20 1-20 MG-MCG tablet     . QUEtiapine (SEROQUEL) 100 MG tablet Take 1 tablet (100 mg total) by  mouth at bedtime. 30 tablet 2   No current facility-administered medications for this visit.     Family History:   Family History  Problem Relation Age of Onset  . ADD / ADHD Brother   . Alcohol abuse Paternal Uncle    General Appearance: alert, oriented, no acute distress and well nourished  Musculoskeletal: Strength & Muscle Tone: within normal limits Gait & Station: normal Patient leans: N/A Mental Status Examination/Evaluation: Objective:  Appearance: Casual  Eye Contact::  Fair  Speech:  Clear and Coherent  Volume:  Normal  Mood:  Euthymic  Affect:  Appropriate, Congruent and Full Range  Thought Process:  Goal Directed and Intact  Orientation:  Full (Time, Place, and Person)  Thought Content:  WDL  Suicidal Thoughts:  No  Homicidal Thoughts:  No  Judgement:  Impaired  Insight:  Shallow  Psychomotor Activity:  Normal  Akathisia:  NA  Handed:  Right  AIMS (if indicated):  N/A  Assets:  Desire for Improvement Housing Physical Health Social Set designer and fund of knowledge: good     Assessment:  Axis I: Major Depression, single episode and Post Traumatic Stress Disorder  AXIS I Major Depression, single episode and Post Traumatic Stress Disorder  AXIS II Cluster C Traits  AXIS III Past Medical History  Diagnosis Date  . Anxiety   . Asthma   . Panic attack 12/11/2012     AXIS IV educational problems and problems related to social environment  AXIS V 61-70 mild symptoms   Treatment Plan/Recommendations:  Plan of Care: Decrease Seroquel to 50 mg at bedtime for mood stabilization as patient is doing well and reports fatigue secondary to the Seroquel Continue Cogentin 1 mg daily for EPS Change  Celexa to 40 mg to bedtime to help with anxiety and depression.  Continue Vistaril 25 mg by mouth 3 times a day when necessary for anxiety/agitation.   Laboratory:   none at this time  Psychotherapy: Discussed with patient that she needs to see  a therapist and psychiatrist on a weekly basis and as it's hard for her to drive down to [facility], she would benefit from seeing a therapist in [place]    Routine PRN Medications:  Yes,Vistaril 25 mg 3 times a day when necessary anxiety/agitation    Safety Concerns:  None at this as patient    Other:  Call as necessary Follow up in 2 months  50% of this visit was spent in discussing coping skills, the need for patient that he see a therapist and psychiatrist there. Hampton Abbot, MD 2/1/20162:57 PM

## 2014-06-05 ENCOUNTER — Other Ambulatory Visit (HOSPITAL_COMMUNITY): Payer: Self-pay | Admitting: Psychiatry

## 2014-07-03 ENCOUNTER — Telehealth (HOSPITAL_COMMUNITY): Payer: Self-pay

## 2014-07-03 DIAGNOSIS — F321 Major depressive disorder, single episode, moderate: Secondary | ICD-10-CM

## 2014-07-03 DIAGNOSIS — F4310 Post-traumatic stress disorder, unspecified: Secondary | ICD-10-CM

## 2014-07-03 NOTE — Telephone Encounter (Signed)
Telephone call with patient's Mother who reported her daughter was still living in [place] and "is doing amazing right now".  Ms. Tiffany Hester questioned if patient's appointment for 07/17/14 could be rescheduled as she stated plans to continue working with patient to find a therapist and local psychiatrist in [place] to begin working with her daughter since she is now settled there and doing well.  Agreed to question if Dr. Dwyane Dee would reorder patient's medications and will e-scribe orders in to patient's pharmacy in [place] if plan and medication refills approved.

## 2014-07-07 MED ORDER — BENZTROPINE MESYLATE 1 MG PO TABS: ORAL_TABLET | ORAL | Status: DC

## 2014-07-07 MED ORDER — QUETIAPINE FUMARATE 50 MG PO TABS: 50.0000 mg | ORAL_TABLET | Freq: Every day | ORAL | Status: DC

## 2014-07-07 MED ORDER — HYDROXYZINE HCL 25 MG PO TABS: 25.0000 mg | ORAL_TABLET | Freq: Three times a day (TID) | ORAL | Status: DC | PRN

## 2014-07-07 MED ORDER — CITALOPRAM HYDROBROMIDE 40 MG PO TABS: 40.0000 mg | ORAL_TABLET | Freq: Every day | ORAL | Status: DC

## 2014-07-07 NOTE — Telephone Encounter (Signed)
Telephone call with patient to verify her current pharmacy[facility] and to verify she is taking Seroquel 50mg  at bedtime.  Informed patient Dr. Dwyane Dee agreed to fill her Seroquel, Cogentin, Celexa and Hydroxyzine plus one additional refill to give patient 2 months to locate another therapist and psychiatrist in the [place] area closer to her school.  Patient denied any symptoms or problems at this time and agreed with plan set forth by her Mother to reschedule her appointment from 07/17/14 for 2 months and will call if any problems during this time.  Arranged to send orders today and for patient to call back if any problems getting medications filled.  Canceled patient's 07/17/14 appointment per her request and rescheduled for 09/07/14 at 3:45pm as approved by Dr. Dwyane Dee.  Called patient's Mother back who agreed with plan and will keep appointment on 09/07/14 if patient does not move services to [place] area over the coming 2 months and will cancel that appointment if gets set up there prior to appointment.  Patient's mother appreciative of Dr. Dwyane Dee agreeing to medication orders and moving appointment back for patient to move services closer to her since she is "doing so well" currently in [place].  All new orders for Seroquel, Cogentin, Celexa and Hydroxyzine [facility] [place], [place] today.

## 2014-07-17 ENCOUNTER — Ambulatory Visit (HOSPITAL_COMMUNITY): Payer: BLUE CROSS/BLUE SHIELD | Admitting: Psychiatry

## 2014-09-01 ENCOUNTER — Other Ambulatory Visit (HOSPITAL_COMMUNITY): Payer: Self-pay | Admitting: Psychiatry

## 2014-09-07 ENCOUNTER — Encounter (HOSPITAL_COMMUNITY): Payer: Self-pay | Admitting: Psychiatry

## 2014-09-07 ENCOUNTER — Ambulatory Visit (INDEPENDENT_AMBULATORY_CARE_PROVIDER_SITE_OTHER): Payer: BLUE CROSS/BLUE SHIELD | Admitting: Psychiatry

## 2014-09-07 VITALS — BP 137/76 | HR 73 | Ht 69.5 in | Wt 117.8 lb

## 2014-09-07 DIAGNOSIS — F329 Major depressive disorder, single episode, unspecified: Secondary | ICD-10-CM

## 2014-09-07 DIAGNOSIS — F4310 Post-traumatic stress disorder, unspecified: Secondary | ICD-10-CM | POA: Diagnosis not present

## 2014-09-07 DIAGNOSIS — F321 Major depressive disorder, single episode, moderate: Secondary | ICD-10-CM

## 2014-09-07 MED ORDER — QUETIAPINE FUMARATE 50 MG PO TABS: 50.0000 mg | ORAL_TABLET | Freq: Every day | ORAL | Status: DC

## 2014-09-07 MED ORDER — CITALOPRAM HYDROBROMIDE 40 MG PO TABS
40.0000 mg | ORAL_TABLET | Freq: Every day | ORAL | Status: DC
Start: 2014-09-07 — End: 2014-11-23

## 2014-09-07 MED ORDER — BENZTROPINE MESYLATE 1 MG PO TABS: ORAL_TABLET | ORAL | Status: DC

## 2014-09-07 NOTE — Progress Notes (Signed)
Patient ID: Tiffany Hester, female   DOB: 1995-10-15, 19 y.o.   MRN: [id number]  Medication management followup  [name] Date of Evaluation:  09/07/2014 Chief Complaint: I am doing fairly well, my boyfriend and I have moved back to [place]   History of present illness: Patient is a 19 year old female diagnosed with major depressive disorder and PTSD who presents today for a followup visit.  Patient reports that she is doing fairly well in regards to her mood and anxiety. She adds that she and her boyfriend have moved back to [place] as a boyfriend's father passed away. She states that they're looking after his younger sister, currently staying at [place]. She states that they're looking for an apartment and will move once they find something.  In regards to her anxiety, patient states that she's doing fairly well. She denies any flashbacks, any problems with sleep, any hypervigilance. She also reports that she's doing fairly well with her depression. On a scale of 0-10, with 0 being no symptoms in 10 being the worst, patient reports her depression as a 1 out of 10. She adds that even with the stress of having to move back to [place], she has done fairly well. She currently denies any aggravating or relieving factors.  Patient denies any symptoms of mania, anxiety or psychosis. Patient denies any side effects,  any complaints at this visit.  Review of Systems  Constitutional: Negative.  Negative for fever, chills, weight loss and malaise/fatigue.  HENT: Negative.  Negative for congestion, hearing loss and sore throat.   Eyes: Negative.  Negative for blurred vision, double vision, discharge and redness.  Respiratory: Negative.  Negative for cough, shortness of breath and wheezing.   Cardiovascular: Negative.  Negative for chest pain and palpitations.  Gastrointestinal: Negative.  Negative for heartburn, nausea, vomiting, abdominal pain, diarrhea and  constipation.  Genitourinary: Negative.  Negative for dysuria, urgency and frequency.  Musculoskeletal: Negative.  Negative for myalgias, joint pain and falls.  Skin: Negative.  Negative for rash.  Neurological: Negative.  Negative for dizziness, tingling, tremors, seizures, loss of consciousness, weakness and headaches.  Endo/Heme/Allergies: Positive for environmental allergies.  Psychiatric/Behavioral: Negative.  Negative for depression, suicidal ideas, hallucinations, memory loss and substance abuse. The patient is not nervous/anxious and does not have insomnia.     Physical Exam There were no vitals taken for this visit.    Past Medical History:   Past Medical History  Diagnosis Date  . Anxiety   . Asthma   . Panic attack 12/11/2012    Allergies:  No Known Allergies Current Medications:  Current Outpatient Prescriptions  Medication Sig Dispense Refill  . benztropine (COGENTIN) 1 MG tablet TAKE 1 TABLET BY MOUTH ONCE A DAY 60 tablet 3  . citalopram (CELEXA) 40 MG tablet Take 1 tablet (40 mg total) by mouth at bedtime. 30 tablet 3  . fluticasone (FLONASE) 50 MCG/ACT nasal spray   0  . hydrOXYzine (ATARAX/VISTARIL) 25 MG tablet Take 1 tablet (25 mg total) by mouth 3 (three) times daily as needed for anxiety. 90 tablet 1  . JUNEL FE 1/20 1-20 MG-MCG tablet     . QUEtiapine (SEROQUEL) 50 MG tablet Take 1 tablet (50 mg total) by mouth at bedtime. 30 tablet 3   No current facility-administered medications for this visit.     Family History:   Family History  Problem Relation Age of Onset  . ADD / ADHD Brother   . Alcohol abuse Paternal Uncle  General Appearance: alert, oriented, no acute distress and well nourished  Musculoskeletal: Strength & Muscle Tone: within normal limits Gait & Station: normal Patient leans: N/A Mental Status Examination/Evaluation: Objective:  Appearance: Casual  Eye Contact::  Fair  Speech:  Clear and Coherent  Volume:  Normal  Mood:   Euthymic  Affect:  Appropriate, Congruent and Full Range  Thought Process:  Goal Directed and Intact  Orientation:  Full (Time, Place, and Person)  Thought Content:  WDL  Suicidal Thoughts:  No  Homicidal Thoughts:  No  Judgement:  Impaired  Insight:  Shallow  Psychomotor Activity:  Normal  Akathisia:  NA  Handed:  Right  AIMS (if indicated):  N/A  Assets:  Desire for Improvement Housing Physical Health Social Set designer and fund of knowledge: good     Assessment:  Axis I: Major Depression, single episode and Post Traumatic Stress Disorder  AXIS I Major Depression, single episode and Post Traumatic Stress Disorder  AXIS II Cluster C Traits  AXIS III Past Medical History  Diagnosis Date  . Anxiety   . Asthma   . Panic attack 12/11/2012     AXIS IV educational problems and problems related to social environment  AXIS V 61-70 mild symptoms   Treatment Plan/Recommendations:  Plan of Care: Continue Seroquel 50 mg at bedtime for mood stabilization and depression Continue Cogentin 1 mg daily for EPS Change  Celexa to 40 mg to bedtime to help with anxiety and depression.  Continue Vistaril 25 mg by mouth 3 times a day when necessary for anxiety/agitation.   Laboratory:   none at this time  Psychotherapy: Discussed the need for patient to see a therapist here at the practice, patient states that she will schedule an appointment in the next few weeks     Routine PRN Medications:  Yes,Vistaril 25 mg 3 times a day when necessary anxiety/agitation    Safety Concerns:  None at this as patient    Other:  Call as necessary Follow up in 3 months  50% of this visit was spent in discussing the medications along with their side effects. Also discussed the need for patient to work on her coping skills, the need to see a therapist to help her continue to be stable. This visit was of moderate complexity Hampton Abbot, MD 5/26/20163:55 PM

## 2014-10-17 ENCOUNTER — Telehealth (HOSPITAL_COMMUNITY): Payer: Self-pay

## 2014-10-17 NOTE — Telephone Encounter (Signed)
Medication management - Patient broke up with BF this past weekend and stopped medications cold [place] a little over a week ago.  Patient was in the background crying during conversation.  Mother wanted to make sure patient could restart medications at same dosage and discussed an earlier appointment due to how distraught patient is currently.  Mother denied patient as a danger to self or others at this time and is staying with her.  Verified with Dr. Lovena Le patient could restart medications as same dosage and only warned Seroquel may make her a little sedated with restart.  Arranged an appointment time for patient to see Dr. Dwyane Dee on 10/19/14 due to current state of instability and called patient's Mother back with the time. Ms. Lavone Neri reminded if she started to fear for patient's safety to please bring her in for an emergency evaluation as Ms. Lavone Neri stated she was glad she would be seen this week and felt she would be fine until then.  Ms. Lavone Neri stated understanding with plan to restart medications this date too.

## 2014-10-19 ENCOUNTER — Ambulatory Visit (HOSPITAL_COMMUNITY): Payer: Self-pay | Admitting: Psychiatry

## 2014-10-26 ENCOUNTER — Ambulatory Visit (INDEPENDENT_AMBULATORY_CARE_PROVIDER_SITE_OTHER): Payer: BLUE CROSS/BLUE SHIELD | Admitting: Psychiatry

## 2014-10-26 ENCOUNTER — Encounter (HOSPITAL_COMMUNITY): Payer: Self-pay | Admitting: Psychiatry

## 2014-10-26 VITALS — BP 121/70 | HR 68 | Ht 69.0 in | Wt 116.2 lb

## 2014-10-26 DIAGNOSIS — F4310 Post-traumatic stress disorder, unspecified: Secondary | ICD-10-CM

## 2014-10-26 DIAGNOSIS — F900 Attention-deficit hyperactivity disorder, predominantly inattentive type: Secondary | ICD-10-CM

## 2014-10-26 DIAGNOSIS — F329 Major depressive disorder, single episode, unspecified: Secondary | ICD-10-CM
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
## 2014-11-09 ENCOUNTER — Ambulatory Visit (HOSPITAL_COMMUNITY): Payer: Self-pay | Admitting: Psychiatry

## 2014-11-23 ENCOUNTER — Ambulatory Visit (INDEPENDENT_AMBULATORY_CARE_PROVIDER_SITE_OTHER): Payer: BLUE CROSS/BLUE SHIELD | Admitting: Psychiatry

## 2014-11-23 VITALS — BP 112/72 | HR 75 | Ht 69.0 in | Wt 112.6 lb

## 2014-11-23 DIAGNOSIS — F900 Attention-deficit hyperactivity disorder, predominantly inattentive type: Secondary | ICD-10-CM

## 2014-11-23 DIAGNOSIS — F329 Major depressive disorder, single episode, unspecified: Secondary | ICD-10-CM | POA: Diagnosis not present

## 2014-11-23 DIAGNOSIS — F988 Other specified behavioral and emotional disorders with onset usually occurring in childhood and adolescence: Secondary | ICD-10-CM

## 2014-11-23 DIAGNOSIS — F321 Major depressive disorder, single episode, moderate: Secondary | ICD-10-CM

## 2014-11-23 DIAGNOSIS — F4310 Post-traumatic stress disorder, unspecified: Secondary | ICD-10-CM

## 2014-11-23 MED ORDER — CITALOPRAM HYDROBROMIDE 40 MG PO TABS: 40.0000 mg | ORAL_TABLET | Freq: Every day | ORAL | Status: DC

## 2014-11-23 MED ORDER — QUETIAPINE FUMARATE 50 MG PO TABS: 50.0000 mg | ORAL_TABLET | Freq: Every day | ORAL | Status: DC

## 2014-11-23 MED ORDER — DEXMETHYLPHENIDATE HCL ER 10 MG PO CP24: 10.0000 mg | ORAL_CAPSULE | Freq: Every day | ORAL | Status: DC

## 2014-11-23 MED ORDER — BENZTROPINE MESYLATE 1 MG PO TABS: ORAL_TABLET | ORAL | Status: DC

## 2014-11-23 NOTE — Progress Notes (Signed)
Patient ID: Tiffany Hester, female   DOB: 1996-01-29, 19 y.o.   MRN: [id number]  Medication management followup  [name] Date of Evaluation:  11/23/2014 Chief Complaint: I'm doing better, I know I need to look for a job   History of present illness: Patient is a 19 year-old diagnosed with PTSD, major depressive disorder who presents today for a follow-up visit  Patient reports that she is still struggling with her focus, gets overwhelmed easily but adds that she no longer feels depressed. She reports that she feels tired at times. Patient states that it still hard for her to have motivation, misses her boyfriend but knows that she needs to move on. She denies any feelings of hopelessness, worthlessness or guilt. She also denies any thoughts of hurting herself or others. She denies any psychotic symptoms, any flashbacks. Next  In regards to substance use, patient reports that she's now occasionally smoking marijuana, is not longer doing it on a regular basis. She states that she also plans to restart seeing a therapist on a regular basis.  On a scale of 0-10, with 0 being no symptoms in 10 being the worst, patient reports that her depression is currently a 4 out of 10. She denies any symptoms of anxiety. She states that staying with her family has been a relieving factor as the parents and brother are supportive. She currently denies any aggravating factors.  Mom reports that patient still struggles with inattention, adds that this has been there for years, feels that her ADD does need to be treated. Mom adds that that will help her to get focused and complete tasks. She states that patient is still not willing to have autism testing and she is okay with that.  They both deny any other complaints at this visit, patient reports that she's eating better. He also denies any side effects of the medication.  Review of Systems  Constitutional: Positive for malaise/fatigue.  Negative for fever, chills and weight loss.  HENT: Negative.  Negative for congestion, hearing loss and sore throat.   Eyes: Negative.  Negative for blurred vision, double vision, discharge and redness.  Respiratory: Negative.  Negative for cough, shortness of breath and wheezing.   Cardiovascular: Negative.  Negative for chest pain and palpitations.  Gastrointestinal: Negative.  Negative for heartburn, nausea, vomiting, abdominal pain, diarrhea and constipation.  Genitourinary: Negative.  Negative for dysuria, urgency, frequency and flank pain.  Musculoskeletal: Negative.  Negative for myalgias, joint pain and falls.  Skin: Negative.  Negative for rash.  Neurological: Negative.  Negative for dizziness, tingling, tremors, seizures, loss of consciousness, weakness and headaches.  Endo/Heme/Allergies: Negative for environmental allergies.  Psychiatric/Behavioral: Positive for substance abuse. Negative for depression, suicidal ideas, hallucinations and memory loss. The patient is not nervous/anxious and does not have insomnia.     Physical Exam vitals were normal    Past Medical History:   Past Medical History  Diagnosis Date  . Anxiety   . Asthma   . Panic attack 12/11/2012    Allergies:  None Current Medications:  Current Outpatient Prescriptions  Medication Sig Dispense Refill  . benztropine (COGENTIN) 1 MG tablet TAKE 1 TABLET BY MOUTH ONCE A DAY 60 tablet 3  . citalopram (CELEXA) 40 MG tablet Take 1 tablet (40 mg total) by mouth at bedtime. 30 tablet 3  . dexmethylphenidate (FOCALIN XR) 10 MG 24 hr capsule Take 1 capsule (10 mg total) by mouth daily. 30 capsule 0  . fluticasone (FLONASE)  50 MCG/ACT nasal spray   0  . hydrOXYzine (ATARAX/VISTARIL) 25 MG tablet Take 1 tablet (25 mg total) by mouth 3 (three) times daily as needed for anxiety. 90 tablet 1  . JUNEL FE 1/20 1-20 MG-MCG tablet     . QUEtiapine (SEROQUEL) 50 MG tablet Take 1 tablet (50 mg total) by mouth at bedtime. 30  tablet 3   No current facility-administered medications for this visit.     Family History:   Family History  Problem Relation Age of Onset  . ADD / ADHD Brother   . Alcohol abuse Paternal Uncle    General Appearance: alert, oriented, no acute distress and well nourished  Musculoskeletal: Strength & Muscle Tone: within normal limits Gait & Station: normal Patient leans: N/A Mental Status Examination/Evaluation: Objective:  Appearance: Casual   Eye Contact::  Fair   Speech:  Clear and Coherent  Volume:  Normal  Mood:  Euthymic  Affect:  Constricted   Thought Process:  Goal Directed and Intact  Orientation:  Full (Time, Place, and Person)  Thought Content:  WDL  Suicidal Thoughts:  No  Homicidal Thoughts:  No  Judgement:  Fair   Insight:  Shallow  Psychomotor Activity:  Normal  Akathisia:  NA  Handed:  Right  AIMS (if indicated):  N/A  Assets:  Desire for Improvement Housing Physical Health Social Set designer and fund of knowledge: good     Assessment:  Axis I: Major Depression, single episode and Post Traumatic Stress Disorder, ADHD inattentive type  AXIS I ADHD, inattentive type, Major Depression, single episode and Post Traumatic Stress Disorder  AXIS II Deferred  AXIS III Past Medical History  Diagnosis Date  . Anxiety   . Asthma   . Panic attack 12/11/2012     AXIS IV educational problems and problems related to social environment  AXIS V 51-60 moderate symptoms   Treatment Plan/Recommendations:  Plan of Care: Continue Seroquel 50 mg at bedtime for mood stabilization and depression Continue Cogentin 1 mg daily for EPS Change  Celexa to 40 mg to bedtime to help with anxiety and depression.  Continue Vistaril 25 mg by mouth 3 times a day when necessary for anxiety/agitation. Start Focalin XR 10 mg 1 in the morning for ADD inattentive type. The risks and benefits were discussed with patient and a consent was obtained   Laboratory:    none at this time  Psychotherapy: To restart seeing Anderson Malta for individual counseling     Routine PRN Medications:  Yes,Vistaril 25 mg 3 times a day when necessary anxiety/agitation    Safety Concerns:  None at this as patient    Other:  Call as necessary Follow up in 2 weeks   50% of this visit was spent in discussing with patient her symptomatology, her breakup with her boyfriend, need for therapy. Also discussed the ADD symptoms, a trial of Focalin XR to see if it will help. This visit was of moderate complexity and exceeded 25 minutes

## 2014-11-28 ENCOUNTER — Ambulatory Visit (INDEPENDENT_AMBULATORY_CARE_PROVIDER_SITE_OTHER): Payer: BLUE CROSS/BLUE SHIELD | Admitting: Psychiatry

## 2014-11-28 DIAGNOSIS — F321 Major depressive disorder, single episode, moderate: Secondary | ICD-10-CM

## 2014-11-29 ENCOUNTER — Encounter (HOSPITAL_COMMUNITY): Payer: Self-pay | Admitting: Psychiatry
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
## 2014-12-28 ENCOUNTER — Ambulatory Visit (INDEPENDENT_AMBULATORY_CARE_PROVIDER_SITE_OTHER): Payer: BLUE CROSS/BLUE SHIELD | Admitting: Psychiatry

## 2014-12-28 VITALS — BP 108/72 | HR 102 | Ht 69.0 in | Wt 117.6 lb

## 2014-12-28 DIAGNOSIS — F900 Attention-deficit hyperactivity disorder, predominantly inattentive type: Secondary | ICD-10-CM | POA: Diagnosis not present

## 2014-12-28 DIAGNOSIS — F4310 Post-traumatic stress disorder, unspecified: Secondary | ICD-10-CM | POA: Diagnosis not present

## 2014-12-28 DIAGNOSIS — F988 Other specified behavioral and emotional disorders with onset usually occurring in childhood and adolescence: Secondary | ICD-10-CM

## 2014-12-28 DIAGNOSIS — F329 Major depressive disorder, single episode, unspecified: Secondary | ICD-10-CM

## 2014-12-28 DIAGNOSIS — F321 Major depressive disorder, single episode, moderate: Secondary | ICD-10-CM

## 2014-12-28 MED ORDER — BENZTROPINE MESYLATE 1 MG PO TABS: ORAL_TABLET | ORAL | Status: DC

## 2014-12-28 MED ORDER — DEXMETHYLPHENIDATE HCL ER 10 MG PO CP24: 10.0000 mg | ORAL_CAPSULE | Freq: Every day | ORAL | Status: DC

## 2014-12-28 MED ORDER — CITALOPRAM HYDROBROMIDE 40 MG PO TABS: 40.0000 mg | ORAL_TABLET | Freq: Every day | ORAL | Status: DC

## 2014-12-28 NOTE — Patient Instructions (Signed)
Let Employer know about being on the Focalin XR

## 2014-12-28 NOTE — Progress Notes (Signed)
Patient ID: Tiffany Hester, female   DOB: 1995-06-11, 19 y.o.   MRN: [id number]  Medication management followup  Patient Identification:  Rocky Rishel Date of Evaluation:  12/28/2014 Chief Complaint: I'm doing well and I interviewed for ajob.I also plan to return back to school   History of present illness: Patient is a 19 year-old diagnosed with PTSD, major depressive disorder who presents today for a follow-up visit   Patient states that she is overall doing well. She adds that she has also interviewed for a job and is hoping she gets it. She adds that she has restarted seeing her ex-boyfriend but reports that she is taking it slow and has told him that if she feels stressed, she will not go out with him. She adds that they are friends now.  In regards to substance use, patient reports that she's now occasionally smoking marijuana, is not longer doing it on a regular basis. She states she has used it once in the past month.  On a scale of 0-10, with 0 being no symptoms in 10 being the worst, patient reports that her depression is currently a 2 out of 10. She denies any symptoms of anxiety.  She currently denies any aggravating factors.  In regards to her focus, patient reports that she is doing well on the [employer] states she is able to stay on task, get her work completed and reports that her mood is also good.  She denies any complaints at this visit, patient reports that she has no side effects with the medications.  Review of Systems  Constitutional: Negative.  Negative for fever, chills, weight loss and malaise/fatigue.  HENT: Negative.  Negative for congestion, hearing loss and sore throat.   Eyes: Negative.  Negative for blurred vision, double vision, discharge and redness.  Respiratory: Negative.  Negative for cough, shortness of breath and wheezing.   Cardiovascular: Negative.  Negative for chest pain and palpitations.  Gastrointestinal: Negative.  Negative for heartburn,  nausea, vomiting, abdominal pain, diarrhea and constipation.  Genitourinary: Negative.  Negative for dysuria, urgency, frequency and flank pain.  Musculoskeletal: Negative.  Negative for myalgias, joint pain and falls.  Skin: Negative.  Negative for rash.  Neurological: Negative.  Negative for dizziness, tingling, tremors, seizures, loss of consciousness, weakness and headaches.  Endo/Heme/Allergies: Negative for environmental allergies.  Psychiatric/Behavioral: Negative.  Negative for depression, suicidal ideas, hallucinations, memory loss and substance abuse. The patient is not nervous/anxious and does not have insomnia.    Social History   Social History  . Marital Status: Single    Spouse Name: N/A  . Number of Children: N/A  . Years of Education: N/A   Occupational History  . Not on file.   Social History Main Topics  . Smoking status: Current Every Day Smoker -- 1.00 packs/day    Types: E-cigarettes, Cigarettes  . Smokeless tobacco: Never Used  . Alcohol Use: 0.0 oz/week    0 Standard drinks or equivalent per week  . Drug Use: Yes    Special: Marijuana  . Sexual Activity: Yes    Birth Control/ Protection: Pill   Other Topics Concern  . Not on file   Social History Narrative    Past Medical History:   Past Medical History  Diagnosis Date  . Anxiety   . Asthma   . Panic attack 12/11/2012    Allergies:  None Current Medications:  Current Outpatient Prescriptions  Medication Sig Dispense Refill  . benztropine (COGENTIN) 1 MG tablet TAKE  1 TABLET BY MOUTH ONCE A DAY 60 tablet 3  . citalopram (CELEXA) 40 MG tablet Take 1 tablet (40 mg total) by mouth at bedtime. 30 tablet 3  . dexmethylphenidate (FOCALIN XR) 10 MG 24 hr capsule Take 1 capsule (10 mg total) by mouth daily. 30 capsule 0  . fluticasone (FLONASE) 50 MCG/ACT nasal spray   0  . hydrOXYzine (ATARAX/VISTARIL) 25 MG tablet Take 1 tablet (25 mg total) by mouth 3 (three) times daily as needed for anxiety. 90  tablet 1  . JUNEL FE 1/20 1-20 MG-MCG tablet     . QUEtiapine (SEROQUEL) 50 MG tablet Take 1 tablet (50 mg total) by mouth at bedtime. 30 tablet 3   No current facility-administered medications for this visit.     Family History:   Family History  Problem Relation Age of Onset  . ADD / ADHD Brother   . Alcohol abuse Paternal Uncle    General Appearance: alert, oriented, no acute distress and well nourished Blood pressure 108/72, pulse 102, height 5\' 9"  (1.753 m), weight 117 lb 9.6 oz (53.343 kg). Musculoskeletal: Strength & Muscle Tone: within normal limits Gait & Station: normal Patient leans: N/A Mental Status Examination/Evaluation: Objective:  Appearance: Casual   Eye Contact::  Fair   Speech:  Clear and Coherent  Volume:  Normal  Mood:  Euthymic  Affect:  Congruent  Thought Process:  Goal Directed and Intact  Orientation:  Full (Time, Place, and Person)  Thought Content:  WDL  Suicidal Thoughts:  No  Homicidal Thoughts:  No  Judgement:  Fair   Insight:  Fair  Psychomotor Activity:  Normal  Akathisia:  NA  Handed:  Right  AIMS (if indicated):  N/A  Assets:  Desire for Improvement Housing Physical Health Social Set designer and fund of knowledge: good     Assessment:  Axis I: Major Depression, single episode and Post Traumatic Stress Disorder, ADHD inattentive type  AXIS I ADHD, inattentive type, Major Depression, single episode and Post Traumatic Stress Disorder  AXIS II Deferred  AXIS III Past Medical History  Diagnosis Date  . Anxiety   . Asthma   . Panic attack 12/11/2012     AXIS IV educational problems and problems related to social environment  AXIS V 61-70 mild symptoms   Treatment Plan/Recommendations:  Plan of Care: Continue Seroquel 50 mg at bedtime for mood stabilization and depression Continue Cogentin 1 mg daily for EPS Change  Celexa to 40 mg to bedtime to help with anxiety and depression.  Continue Vistaril 25 mg  by mouth 3 times a day when necessary for anxiety/agitation.  Continue Focalin XR 10 mg 1 in the morning for ADD inattentive type.    Laboratory:   none at this time  Psychotherapy: Continue seeing Anderson Malta for individual counseling     Routine PRN Medications:  Yes,Vistaril 25 mg 3 times a day when necessary anxiety/agitation    Safety Concerns:  None at this as patient    Other:  Call as necessary Follow up in 3 months   50% of this visit was spent in discussing with patient the need to quit marijuana use, the benefits of getting an education, and being independent. Discussed in length patient's ex-boyfriend as she is now seeing him again, the stress the relationship has caused in the past.also discussed letting the HR person at the place patient interviewedknow that she is on a stimulant for ADD inattentive type. This visit was of moderate complexity and exceeded  25 minutes

## 2014-12-30 ENCOUNTER — Encounter (HOSPITAL_COMMUNITY): Payer: Self-pay | Admitting: Psychiatry

## 2015-01-11 ENCOUNTER — Ambulatory Visit (HOSPITAL_COMMUNITY): Payer: Self-pay | Admitting: Psychiatry

## 2015-02-28 ENCOUNTER — Telehealth (HOSPITAL_COMMUNITY): Payer: Self-pay

## 2015-02-28 DIAGNOSIS — F988 Other specified behavioral and emotional disorders with onset usually occurring in childhood and adolescence: Secondary | ICD-10-CM

## 2015-02-28 MED ORDER — DEXMETHYLPHENIDATE HCL ER 10 MG PO CP24: 10.0000 mg | ORAL_CAPSULE | Freq: Every day | ORAL | Status: DC

## 2015-02-28 NOTE — Telephone Encounter (Signed)
Medication management - Telephone call with pt's Mother to follow up on message left she had to cancel and reschedule patient's next appt. with Dr. Dwyane Dee now for 03/15/15.  Requests refills of medications but not sure what all is needed.  Agreed to call patient's [facility] in [place] to verify what they have filled and if they have refills enough to last until patient's rescheduled appointment.  Agreed if not enough medication until appointment then would send request to Dr. Dwyane Dee.  Called [facility] in [place] and spoke to [place], pharmacist who reported patient has at least one refill left on Seroquel, Cogentin and Celexa.  States no refills left on Hydroxyzine but will have patient call if this order is needed since has not filled since April.  States patient will need a new Focalin XR order as last one filled in 01/26/15.  Patient in need of a one time new order for Focalin XR.

## 2015-02-28 NOTE — Telephone Encounter (Signed)
Met with Dr. Lovena Le who approved a one time refill of patient's Focalin XR.  New order printed and then reviewed and signed by Dr. Lovena Le.   Telephone call with patient's Mother to inform a new Focalin XR order would be left at our front reception for patient to pick up and informed patient has at least one refill of all other medications at her [facility] in [place].

## 2015-03-01 ENCOUNTER — Ambulatory Visit (HOSPITAL_COMMUNITY): Payer: Self-pay | Admitting: Psychiatry

## 2015-03-14 ENCOUNTER — Telehealth (HOSPITAL_COMMUNITY): Payer: Self-pay

## 2015-03-14 NOTE — Telephone Encounter (Signed)
Tiffany Hester picked up her prescription on [id number]  lic  [id number]  [place]lo

## 2015-03-15 ENCOUNTER — Ambulatory Visit (INDEPENDENT_AMBULATORY_CARE_PROVIDER_SITE_OTHER): Payer: BLUE CROSS/BLUE SHIELD | Admitting: Psychiatry

## 2015-03-15 ENCOUNTER — Encounter (HOSPITAL_COMMUNITY): Payer: Self-pay | Admitting: Psychiatry

## 2015-03-15 VITALS — BP 108/70 | HR 75 | Ht 70.0 in | Wt 126.4 lb

## 2015-03-15 DIAGNOSIS — F4310 Post-traumatic stress disorder, unspecified: Secondary | ICD-10-CM

## 2015-03-15 DIAGNOSIS — F988 Other specified behavioral and emotional disorders with onset usually occurring in childhood and adolescence: Secondary | ICD-10-CM

## 2015-03-15 DIAGNOSIS — F909 Attention-deficit hyperactivity disorder, unspecified type: Secondary | ICD-10-CM

## 2015-03-15 DIAGNOSIS — F321 Major depressive disorder, single episode, moderate: Secondary | ICD-10-CM | POA: Diagnosis not present

## 2015-03-15 MED ORDER — DEXMETHYLPHENIDATE HCL ER 10 MG PO CP24: 10.0000 mg | ORAL_CAPSULE | Freq: Every day | ORAL | Status: DC

## 2015-03-15 MED ORDER — QUETIAPINE FUMARATE 50 MG PO TABS: 50.0000 mg | ORAL_TABLET | Freq: Every day | ORAL | Status: DC

## 2015-03-15 MED ORDER — CITALOPRAM HYDROBROMIDE 40 MG PO TABS: 40.0000 mg | ORAL_TABLET | Freq: Every day | ORAL | Status: DC

## 2015-03-15 MED ORDER — HYDROXYZINE HCL 25 MG PO TABS: 25.0000 mg | ORAL_TABLET | Freq: Three times a day (TID) | ORAL | Status: DC | PRN

## 2015-03-15 MED ORDER — BENZTROPINE MESYLATE 1 MG PO TABS: ORAL_TABLET | ORAL | Status: DC

## 2015-03-15 NOTE — Progress Notes (Signed)
Patient ID: Tiffany Hester, female   DOB: [birth year], 19 y.o.   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 She states that the relationship has improved greatly.  On a scale of 0-10, with 0 being no symptoms in 10 being the worst, patient reports that her depression is currently a 2 out of 10. She denies any symptoms of anxiety.  She currently denies any aggravating factors and adds that her improve relationship with her boyfriend and having a good job has been relieving factors  In regards to her focus, patient reports that she is doing well on the [employer] states she is able to stay on task, get her work completed and reports that her mood is also good.  In regards to substance use, patient reports that she's now occasionally smoking marijuana, is not longer doing it on a regular basis. She states that she uses it about once a week and adds that she is trying to quit using it. She also denies any tobacco use or alcohol use  Patient states that she is doing well in regards to her mood, denies any depressive symptoms, any mood irritability. She states that she's not having any side effects of the medications and denies any concerns at this visit  Review of Systems  Constitutional: Negative.  Negative for fever, chills, weight loss and malaise/fatigue.  HENT: Negative.  Negative for congestion, hearing loss and sore throat.   Eyes: Negative.  Negative for blurred vision, double vision, discharge and redness.  Respiratory: Negative.  Negative for cough, shortness of breath  and wheezing.   Cardiovascular: Negative.  Negative for chest pain and palpitations.  Gastrointestinal: Negative.  Negative for heartburn, nausea, vomiting, abdominal pain, diarrhea and constipation.  Genitourinary: Negative.  Negative for dysuria, urgency, frequency and flank pain.  Musculoskeletal: Negative.  Negative for myalgias, joint pain and falls.  Skin: Negative.  Negative for rash.  Neurological: Negative.  Negative for dizziness, tingling, tremors, seizures, loss of consciousness, weakness and headaches.  Endo/Heme/Allergies: Negative for environmental allergies.  Psychiatric/Behavioral: Negative.  Negative for depression, suicidal ideas, hallucinations, memory loss and substance abuse. The patient is not nervous/anxious and does not have insomnia.    Social History   Social History  . Marital Status: Single    Spouse Name: N/A  . Number of Children: N/A  . Years of Education: N/A   Occupational History  . Not on file.   Social History Main Topics  . Smoking status: Current Every Day Smoker -- 1.00 packs/day    Types: E-cigarettes, Cigarettes  . Smokeless tobacco: Never Used  . Alcohol Use: 0.0 oz/week    0 Standard drinks or equivalent per week  . Drug Use: Yes    Special: Marijuana  . Sexual Activity: Yes    Birth Control/ Protection: Pill   Other Topics Concern  . Not on file   Social History Narrative   Patient is living with her boyfriend and boyfriend sister in [place]. Her parents live in [place] along with her younger sibling. She is currently employed at [employer]  Past Medical History:   Past Medical History  Diagnosis Date  . Anxiety   . Asthma   . Panic attack 12/11/2012    Allergies:  None Current Medications:  Current Outpatient Prescriptions  Medication Sig Dispense Refill  . benztropine (COGENTIN) 1 MG tablet TAKE 1 TABLET BY MOUTH ONCE A DAY 60 tablet 3  . citalopram (CELEXA) 40 MG tablet Take 1 tablet (40 mg total) by  mouth at bedtime. 30 tablet 3  . dexmethylphenidate (FOCALIN XR) 10 MG 24 hr capsule Take 1 capsule (10 mg total) by mouth daily. 30 capsule 0  . fluticasone (FLONASE) 50 MCG/ACT nasal spray   0  . hydrOXYzine (ATARAX/VISTARIL) 25 MG tablet Take 1 tablet (25 mg total) by mouth 3 (three) times daily as needed for anxiety. 90 tablet 1  . JUNEL FE 1/20 1-20 MG-MCG tablet     . QUEtiapine (SEROQUEL) 50 MG tablet Take 1 tablet (50 mg total) by mouth at bedtime. 30 tablet 3   No current facility-administered medications for this visit.     Family History:   Family History  Problem Relation Age of Onset  . ADD / ADHD Brother   . Alcohol abuse Paternal Uncle    General Appearance: alert, oriented, no acute distress and well nourished Blood pressure 108/70, pulse 75, height 5\' 10"  (1.778 m), weight 126 lb 6.4 oz (57.335 kg). Musculoskeletal: Strength & Muscle Tone: within normal limits Gait & Station: normal Patient leans: N/A Mental Status Examination/Evaluation: Objective:  Appearance: Casual   Eye Contact::  Fair   Speech:  Clear and Coherent  Volume:  Normal  Mood:  Euthymic  Affect:  Congruent  Thought Process:  Goal Directed and Intact  Orientation:  Full (Time, Place, and Person)  Thought Content:  WDL  Suicidal Thoughts:  No  Homicidal Thoughts:  No  Judgement:  Fair   Insight:  Fair  Psychomotor Activity:  Normal  Akathisia:  NA  Handed:  Right  AIMS (if indicated):  N/A  Assets:  Desire for Improvement Housing Physical Health Social Set designer and fund of knowledge: good     Assessment:  Axis I: Major Depression, single episode and Post Traumatic Stress Disorder, ADHD inattentive type  AXIS I ADHD, inattentive type, Major Depression, single episode and Post Traumatic Stress Disorder  AXIS II Deferred  AXIS III Past Medical History  Diagnosis Date  . Anxiety   . Asthma   . Panic attack 12/11/2012     AXIS IV educational problems and  problems related to social environment  AXIS V 61-70 mild symptoms   Treatment Plan/Recommendations:  Plan of Care: Continue Seroquel 50 mg at bedtime for mood stabilization and depression Continue Cogentin 1 mg daily for EPS Change  Celexa to 40 mg to bedtime to help with anxiety and depression.   Continue Focalin XR 10 mg 1 in the morning for ADD inattentive type.    Laboratory:   none at this time  Psychotherapy: Continue seeing Anderson Malta for individual counseling     Routine PRN Medications:  Yes,Vistaril 25 mg 3 times a day when necessary anxiety/agitation    Safety Concerns:  None at this as patient    Other:  Call as necessary Follow up in 6 months   50% of this visit was spent in discussing with patient again the need to quit marijuana use, patient reports that she is using less. Discussed with patient the need to continue to take her medications regularly, call the office  if she has any concerns. Also discussed the need to continue to work on coping mechanisms, her relationship with her boyfriend as it does become a stressor.

## 2015-03-21 ENCOUNTER — Other Ambulatory Visit (HOSPITAL_COMMUNITY): Payer: Self-pay | Admitting: Psychiatry

## 2015-04-30 ENCOUNTER — Telehealth (HOSPITAL_COMMUNITY): Payer: Self-pay

## 2015-04-30 DIAGNOSIS — F988 Other specified behavioral and emotional disorders with onset usually occurring in childhood and adolescence: Secondary | ICD-10-CM

## 2015-04-30 NOTE — Telephone Encounter (Signed)
Patient walked in with request for a new Focalin order.  Patient last seen on 03/15/15 and last order was to be filled on 03/28/15.  Patient does not return until 09/13/15.

## 2015-05-01 MED ORDER — DEXMETHYLPHENIDATE HCL ER 10 MG PO CP24: 10.0000 mg | ORAL_CAPSULE | Freq: Every day | ORAL | Status: DC

## 2015-05-01 NOTE — Telephone Encounter (Signed)
Refilled Focalin XR10 mg daily # 30 no additional refills

## 2015-05-02 NOTE — Telephone Encounter (Signed)
Yes, we can.

## 2015-05-16 ENCOUNTER — Telehealth (HOSPITAL_COMMUNITY): Payer: Self-pay

## 2015-05-16 NOTE — Telephone Encounter (Signed)
Patient picked up prescription on 0000000  lic  [id number]  dlo

## 2015-05-28 ENCOUNTER — Other Ambulatory Visit (HOSPITAL_COMMUNITY): Payer: Self-pay | Admitting: Psychiatry

## 2015-05-28 ENCOUNTER — Other Ambulatory Visit (HOSPITAL_COMMUNITY): Payer: Self-pay

## 2015-05-28 DIAGNOSIS — F4310 Post-traumatic stress disorder, unspecified: Secondary | ICD-10-CM

## 2015-05-28 NOTE — Telephone Encounter (Signed)
Patient needs a refill on vistaril. She was last seen in office on January and has a f/u for June. Last rx for medication was in December for 30 days. Would it be okay to refill for 30 days? Please advise  She also needed a prior auth for Focalin, I called the insurance and got approved for 3 years.

## 2015-05-29 MED ORDER — HYDROXYZINE HCL 25 MG PO TABS: ORAL_TABLET | ORAL | Status: DC

## 2015-05-29 NOTE — Telephone Encounter (Signed)
Met with Dr. Doyne Keel, helping to cover for Dr. Dwyane Dee out of the office this date to discuss patient's request for a refill of her prn Hydroxyzine.  Dr. Doyne Keel approved a one time refill of Hydroxyzine 25 mg, one tablet three times daily as needed for anxiety, #90 with no refills and e-scribed order to patient's [facility] in [place], [place].  Telephone call with patient to inform a new Hydroxyzine order had been e-scribed to her [facility] in [facility].  Patient to call back if any problems getting medication filled.

## 2015-06-26 ENCOUNTER — Other Ambulatory Visit: Payer: Self-pay | Admitting: Gastroenterology

## 2015-06-26 DIAGNOSIS — R112 Nausea with vomiting, unspecified: Secondary | ICD-10-CM

## 2015-06-26 DIAGNOSIS — R194 Change in bowel habit: Secondary | ICD-10-CM

## 2015-06-26 DIAGNOSIS — R1012 Left upper quadrant pain: Secondary | ICD-10-CM

## 2015-07-03 ENCOUNTER — Telehealth (HOSPITAL_COMMUNITY): Payer: Self-pay

## 2015-07-03 DIAGNOSIS — F988 Other specified behavioral and emotional disorders with onset usually occurring in childhood and adolescence: Secondary | ICD-10-CM

## 2015-07-03 NOTE — Telephone Encounter (Signed)
Patients mother calling, she needs a refill on her focalin, this is a patient of Dr. Jodelle Green, she was last seen 12/1 and has a follow up on 6/1. Okay to refill? Please advise, thank you

## 2015-07-04 ENCOUNTER — Other Ambulatory Visit (HOSPITAL_COMMUNITY): Payer: Self-pay

## 2015-07-04 DIAGNOSIS — F988 Other specified behavioral and emotional disorders with onset usually occurring in childhood and adolescence: Secondary | ICD-10-CM

## 2015-07-04 MED ORDER — DEXMETHYLPHENIDATE HCL ER 10 MG PO CP24: 10.0000 mg | ORAL_CAPSULE | Freq: Every day | ORAL | Status: DC

## 2015-07-04 NOTE — Telephone Encounter (Signed)
This was printed this morning for Dr. Salem Senate to sign however she is out today. I reprinted for Dr. Lovena Le to sign

## 2015-07-04 NOTE — Telephone Encounter (Signed)
Dr Salem Senate agreed to order patients F ocalin for one month. This was printed and I called the patient to let her know that it was done

## 2015-07-05 ENCOUNTER — Telehealth (HOSPITAL_COMMUNITY): Payer: Self-pay

## 2015-07-05 ENCOUNTER — Ambulatory Visit
Admission: RE | Admit: 2015-07-05 | Discharge: 2015-07-05 | Disposition: A | Discharge status: Home / Self Care | Payer: 59 | Source: Ambulatory Visit | Attending: Gastroenterology | Admitting: Gastroenterology

## 2015-07-05 DIAGNOSIS — R1012 Left upper quadrant pain: Secondary | ICD-10-CM

## 2015-07-05 DIAGNOSIS — R112 Nausea with vomiting, unspecified: Secondary | ICD-10-CM

## 2015-07-05 DIAGNOSIS — R194 Change in bowel habit: Secondary | ICD-10-CM

## 2015-07-05 MED ORDER — IOPAMIDOL (ISOVUE-300) INJECTION 61%
100.0000 mL | Freq: Once | INTRAVENOUS | Status: AC | PRN
  Administered 2015-07-05: 100 mL via INTRAVENOUS

## 2015-07-05 NOTE — Telephone Encounter (Signed)
07/05/15 2:15pm Patient's mother Tiffany Hester  [id number].Marland KitchenMariana Hester

## 2015-07-17 ENCOUNTER — Other Ambulatory Visit (HOSPITAL_COMMUNITY): Payer: Self-pay | Admitting: Psychiatry

## 2015-07-17 NOTE — Telephone Encounter (Signed)
This is Dr. Kumar's pt.  

## 2015-07-28 ENCOUNTER — Other Ambulatory Visit (HOSPITAL_COMMUNITY): Payer: Self-pay | Admitting: Psychiatry

## 2015-08-09 ENCOUNTER — Other Ambulatory Visit (HOSPITAL_COMMUNITY): Payer: Self-pay | Admitting: Psychiatry

## 2015-08-09 DIAGNOSIS — F988 Other specified behavioral and emotional disorders with onset usually occurring in childhood and adolescence: Secondary | ICD-10-CM

## 2015-08-09 MED ORDER — DEXMETHYLPHENIDATE HCL ER 10 MG PO CP24: 10.0000 mg | ORAL_CAPSULE | Freq: Every day | ORAL | Status: DC

## 2015-08-09 NOTE — Telephone Encounter (Signed)
Refill for FocalinXR done

## 2015-09-13 ENCOUNTER — Encounter (HOSPITAL_COMMUNITY): Payer: Self-pay | Admitting: Psychiatry

## 2015-09-13 ENCOUNTER — Ambulatory Visit (INDEPENDENT_AMBULATORY_CARE_PROVIDER_SITE_OTHER): Payer: 59 | Admitting: Psychiatry

## 2015-09-13 VITALS — BP 120/78 | HR 88 | Ht 69.5 in | Wt 127.6 lb

## 2015-09-13 DIAGNOSIS — F4310 Post-traumatic stress disorder, unspecified: Secondary | ICD-10-CM | POA: Diagnosis not present

## 2015-09-13 DIAGNOSIS — F988 Other specified behavioral and emotional disorders with onset usually occurring in childhood and adolescence: Secondary | ICD-10-CM

## 2015-09-13 DIAGNOSIS — F321 Major depressive disorder, single episode, moderate: Secondary | ICD-10-CM

## 2015-09-13 DIAGNOSIS — F909 Attention-deficit hyperactivity disorder, unspecified type: Secondary | ICD-10-CM | POA: Diagnosis not present

## 2015-09-13 MED ORDER — QUETIAPINE FUMARATE 50 MG PO TABS: ORAL_TABLET | ORAL | Status: DC

## 2015-09-13 MED ORDER — CITALOPRAM HYDROBROMIDE 40 MG PO TABS: ORAL_TABLET | ORAL | Status: DC

## 2015-09-13 MED ORDER — DEXMETHYLPHENIDATE HCL ER 10 MG PO CP24: 10.0000 mg | ORAL_CAPSULE | Freq: Every day | ORAL | Status: DC

## 2015-09-13 MED ORDER — BENZTROPINE MESYLATE 1 MG PO TABS: ORAL_TABLET | ORAL | Status: DC

## 2015-09-13 NOTE — Progress Notes (Signed)
Patient ID: Tiffany Hester, female   DOB: 1996/03/21, 20 y.o.   MRN: [id number]  Medication management followup  [name] Date of Evaluation:  09/13/2015 Chief Complaint: I'm doing well and I'm not dating now   History of present illness:  Patient is a 20 year old diagnosed with PTSD, ADD inattentive type and major depressive disorder who presents today for follow-up visit  Patient reports that she is doing well, adds that she is no longer in a relationship. She states that she's getting along with her family and likes living at home. She reports that she's not work much since [employer] and knows that she needs to find another job  On a scale of 0-10, with 0 being no symptoms in 10 being the worst, patient reports that her depression is currently a 1 out of 10 and on the same scale her anxiety is a 2 out of 10.  She currently denies any aggravating factors and reports that her parents being supportive helps greatly  In regards to her medications, patient reports that she is doing fairly well. She denies any side effects but does report that she continues to take Cogentin.  In regards to marijuana use, patient reports that she is no longer using it. She also denies any tobacco use or alcohol use  Patient states that she knows she needs to return back to college, plans to start taking some classes at [employer] in the fall. She denies any concerns at this visit, any safety issues  Review of Systems  Constitutional: Negative.  Negative for fever, chills, weight loss and malaise/fatigue.  HENT: Negative.  Negative for congestion, hearing loss and sore throat.   Eyes: Negative.  Negative for blurred vision, double vision, discharge and redness.  Respiratory: Negative.  Negative for cough, shortness of breath and wheezing.   Cardiovascular: Negative.  Negative for chest pain and palpitations.  Gastrointestinal: Negative.  Negative for heartburn, nausea, vomiting,  abdominal pain, diarrhea and constipation.  Genitourinary: Negative.  Negative for dysuria, urgency, frequency and flank pain.  Musculoskeletal: Negative.  Negative for myalgias, joint pain and falls.  Skin: Negative.  Negative for rash.  Neurological: Negative.  Negative for dizziness, tingling, tremors, seizures, loss of consciousness, weakness and headaches.  Endo/Heme/Allergies: Negative for environmental allergies.  Psychiatric/Behavioral: Negative.  Negative for depression, suicidal ideas, hallucinations, memory loss and substance abuse. The patient is not nervous/anxious and does not have insomnia.    Social History   Social History  . Marital Status: Single    Spouse Name: Tiffany Hester  . Number of Children: Tiffany Hester  . Years of Education: Tiffany Hester   Occupational History  . Not on file.   Social History Main Topics  . Smoking status: Current Every Day Smoker -- 1.00 packs/day    Types: E-cigarettes, Cigarettes  . Smokeless tobacco: Never Used  . Alcohol Use: 0.0 oz/week    0 Standard drinks or equivalent per week  . Drug Use: Yes    Special: Marijuana  . Sexual Activity: Yes    Birth Control/ Protection: Pill   Other Topics Concern  . Not on file   Social History Narrative   Patient is living with her parents in [place], [place] She is currently employed at [employer] but only works when they have multiple orders and currently has not had many hours  Past Medical History:   Past Medical History  Diagnosis Date  . Anxiety   . Asthma   . Panic attack  12/11/2012    Allergies:  None Current Medications:  Current Outpatient Prescriptions  Medication Sig Dispense Refill  . albuterol (PROAIR HFA) 108 (90 Base) MCG/ACT inhaler     . benztropine (COGENTIN) 1 MG tablet TAKE 1 TABLET BY MOUTH ONCE A DAY 60 tablet 3  . citalopram (CELEXA) 40 MG tablet TAKE 1 TABLET (40 MG TOTAL) BY MOUTH AT BEDTIME. 30 tablet 3  . dexmethylphenidate (FOCALIN XR) 10 MG 24 hr capsule Take 1 capsule  (10 mg total) by mouth daily. 90 capsule 0  . hydrOXYzine (ATARAX/VISTARIL) 25 MG tablet TAKE 1 TABLET BY MOUTH 3 TIMES DAILY AS NEEDED FOR ANXIETY. 90 tablet 0  . JUNEL FE 1/20 1-20 MG-MCG tablet     . QUEtiapine (SEROQUEL) 50 MG tablet TAKE 1 TABLET (50 MG TOTAL) BY MOUTH AT BEDTIME. 30 tablet 3   No current facility-administered medications for this visit.     Family History:   Family History  Problem Relation Age of Onset  . ADD / ADHD Brother   . Alcohol abuse Paternal Uncle    General Appearance: alert, oriented, no acute distress and well nourished Blood pressure 120/78, pulse 88, height 5' 9.5" (1.765 m), weight 127 lb 9.6 oz (57.879 kg). Musculoskeletal: Strength & Muscle Tone: within normal limits Gait & Station: normal Patient leans: Tiffany Hester Mental Status Examination/Evaluation: Objective:  Appearance: Casual   Eye Contact::  Fair   Speech:  Clear and Coherent  Volume:  Normal  Mood:  Euthymic  Affect:  Congruent  Thought Process:  Goal Directed and Intact  Orientation:  Full (Time, Place, and Person)  Thought Content:  WDL  Suicidal Thoughts:  No  Homicidal Thoughts:  No  Judgement:  Fair   Insight:  Fair  Psychomotor Activity:  Normal  Akathisia:  NA  Handed:  Right  AIMS (if indicated):  Tiffany Hester  Assets:  Desire for Improvement Housing Physical Health Social Set designer and fund of knowledge: good     Assessment:  Axis I: Major Depression, single episode and Post Traumatic Stress Disorder, ADHD inattentive type  AXIS I ADHD, inattentive type, Major Depression, single episode and Post Traumatic Stress Disorder  AXIS II Deferred  AXIS III Past Medical History  Diagnosis Date  . Anxiety   . Asthma   . Panic attack 12/11/2012     AXIS IV educational problems and problems related to social environment  AXIS V 61-70 mild symptoms   Treatment Plan/Recommendations:  Plan of Care: Continue Seroquel 50 mg at bedtime for mood stabilization  and depression Continue Cogentin 1 mg daily for EPS Change  Celexa to 40 mg to bedtime to help with anxiety and depression.   Continue Focalin XR 10 mg 1 in the morning for ADD inattentive type.    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

## 2015-12-20 ENCOUNTER — Ambulatory Visit (INDEPENDENT_AMBULATORY_CARE_PROVIDER_SITE_OTHER): Payer: 59 | Admitting: Psychiatry

## 2015-12-20 ENCOUNTER — Encounter (HOSPITAL_COMMUNITY): Payer: Self-pay | Admitting: Psychiatry

## 2015-12-20 DIAGNOSIS — F909 Attention-deficit hyperactivity disorder, unspecified type: Secondary | ICD-10-CM | POA: Diagnosis not present

## 2015-12-20 DIAGNOSIS — F321 Major depressive disorder, single episode, moderate: Secondary | ICD-10-CM | POA: Diagnosis not present

## 2015-12-20 DIAGNOSIS — F4310 Post-traumatic stress disorder, unspecified: Secondary | ICD-10-CM

## 2015-12-20 DIAGNOSIS — F988 Other specified behavioral and emotional disorders with onset usually occurring in childhood and adolescence: Secondary | ICD-10-CM

## 2015-12-20 MED ORDER — BENZTROPINE MESYLATE 1 MG PO TABS: ORAL_TABLET | ORAL | 3 refills | Status: DC

## 2015-12-20 MED ORDER — DEXMETHYLPHENIDATE HCL ER 10 MG PO CP24: 10.0000 mg | ORAL_CAPSULE | Freq: Every day | ORAL | 0 refills | Status: DC

## 2015-12-20 MED ORDER — CITALOPRAM HYDROBROMIDE 40 MG PO TABS: ORAL_TABLET | ORAL | 3 refills | Status: DC

## 2015-12-20 MED ORDER — QUETIAPINE FUMARATE 50 MG PO TABS
ORAL_TABLET | ORAL | 3 refills | Status: DC
Start: 2015-12-20 — End: 2016-06-17

## 2015-12-20 NOTE — Progress Notes (Signed)
Patient ID: Tiffany Hester, female   DOB: 03-Feb-1996, 20 y.o.   MRN: [id number]  Medication management followup  Patient Identification:  Pang Godinho Date of Evaluation:  12/20/2015 Chief Complaint: I'm doing well at home and at work   History of present illness:  Patient is a 20 year old diagnosed with PTSD, ADD inattentive type and major depressive disorder who presents today for follow-up visit  Patient reports that she is doing well at home and at work. She adds that her new boyfriend is really nice, has a stable job and denies having any problems in her relationship. Patient states that she is still working at [employer], has another part-time job in addition. In regards to school, patient states she knows she should start back but has not enrolled herself for any classes.  On a scale of 0-10, with 0 being no symptoms in 10 being the worst, patient reports that her depression and anxiety are both  1 out of 10.  She currently denies any aggravating factors. In regards to relieving factors, patient states that her relationship with her boyfriend and has support from her family help.  In regards to her medications, patient reports that she is doing fairly well. She denies any side effects but does report that she continues to take Cogentin.  In regards to marijuana use, patient reports that she is no longer using it. She also denies any tobacco use or alcohol use She denies any concerns at this visit, any safety issues  Review of Systems  Constitutional: Negative.  Negative for chills, fever, malaise/fatigue and weight loss.  HENT: Negative.  Negative for congestion, hearing loss and sore throat.   Eyes: Negative.  Negative for blurred vision, double vision, discharge and redness.  Respiratory: Negative.  Negative for cough, shortness of breath and wheezing.   Cardiovascular: Negative.  Negative for chest pain and palpitations.  Gastrointestinal: Negative.  Negative for abdominal pain,  constipation, diarrhea, heartburn, nausea and vomiting.  Genitourinary: Negative.  Negative for dysuria, flank pain, frequency and urgency.  Musculoskeletal: Negative.  Negative for falls, joint pain and myalgias.  Skin: Negative.  Negative for rash.  Neurological: Negative.  Negative for dizziness, tingling, tremors, seizures, loss of consciousness, weakness and headaches.  Endo/Heme/Allergies: Negative for environmental allergies.  Psychiatric/Behavioral: Negative.  Negative for depression, hallucinations, memory loss, substance abuse and suicidal ideas. The patient is not nervous/anxious and does not have insomnia.    Social History   Social History  . Marital status: Single    Spouse name: N/A  . Number of children: N/A  . Years of education: N/A   Occupational History  . Not on file.   Social History Main Topics  . Smoking status: Current Every Day Smoker    Packs/day: 1.00    Types: E-cigarettes, Cigarettes  . Smokeless tobacco: Never Used  . Alcohol use 0.0 oz/week  . Drug use:     Types: Marijuana  . Sexual activity: Yes    Birth control/ protection: Pill   Other Topics Concern  . Not on file   Social History Narrative  . No narrative on file   Patient is living with her parents in [place], [place] She is currently employed at [employer] , has another part-time job. She also is in her relationship  Past Medical History:   Past Medical History:  Diagnosis Date  . Anxiety   . Asthma   . Panic attack 12/11/2012    Allergies:  None Current Medications:  Current  Outpatient Prescriptions  Medication Sig Dispense Refill  . albuterol (PROAIR HFA) 108 (90 Base) MCG/ACT inhaler     . benztropine (COGENTIN) 1 MG tablet TAKE 1 TABLET BY MOUTH ONCE A DAY 60 tablet 3  . citalopram (CELEXA) 40 MG tablet TAKE 1 TABLET (40 MG TOTAL) BY MOUTH AT BEDTIME. 30 tablet 3  . dexmethylphenidate (FOCALIN XR) 10 MG 24 hr capsule Take 1 capsule (10 mg total) by mouth daily. 90  capsule 0  . JUNEL FE 1/20 1-20 MG-MCG tablet     . QUEtiapine (SEROQUEL) 50 MG tablet TAKE 1 TABLET (50 MG TOTAL) BY MOUTH AT BEDTIME. 30 tablet 3   No current facility-administered medications for this visit.      Family History:   Family History  Problem Relation Age of Onset  . ADD / ADHD Brother   . Alcohol abuse Paternal Uncle    General Appearance: alert, oriented, no acute distress and well nourished Blood pressure 102/66, pulse 93, height 5\' 8"  (1.727 m), weight 128 lb (58.1 kg). Musculoskeletal: Strength & Muscle Tone: within normal limits Gait & Station: normal Patient leans: N/A Mental Status Examination/Evaluation: Objective:  Appearance: Casual   Eye Contact::  Fair   Speech:  Clear and Coherent  Volume:  Normal  Mood:  Euthymic  Affect:  Congruent  Thought Process:  Goal Directed and Intact  Orientation:  Full (Time, Place, and Person)  Thought Content:  WDL  Suicidal Thoughts:  No  Homicidal Thoughts:  No  Judgement:  Fair   Insight:  Fair  Psychomotor Activity:  Normal  Akathisia:  NA  Handed:  Right  AIMS (if indicated):  N/A  Assets:  Desire for Improvement Housing Physical Health Social Set designer and fund of knowledge: good     Assessment:  Axis I: Major Depression, single episode and Post Traumatic Stress Disorder, ADHD inattentive type  AXIS I ADHD, inattentive type, Major Depression, single episode and Post Traumatic Stress Disorder  AXIS II Deferred  AXIS III Past Medical History:  Diagnosis Date  . Anxiety   . Asthma   . Panic attack 12/11/2012     AXIS IV educational problems and problems related to social environment  AXIS V 61-70 mild symptoms   Treatment Plan/Recommendations:  Plan of Care: Continue Seroquel 50 mg at bedtime for mood stabilization and depression Continue Cogentin 1 mg daily for EPS Change  Celexa to 40 mg to bedtime to help with anxiety and depression.   Continue Focalin XR 10 mg 1 in  the morning for ADD inattentive type.    Laboratory:   none at this time  Psychotherapy: Continue seeing Anderson Malta for individual counseling     Routine PRN Medications:  Yes,Vistaril 25 mg 3 times a day when necessary anxiety/agitation    Safety Concerns:  None at this as patient    Other:  Call as necessary Follow up in 6 months   50% of this visit was spent in discussing with patient, the need for patient to attend some classes at [employer], the need to become independent.

## 2016-04-10 ENCOUNTER — Ambulatory Visit (HOSPITAL_COMMUNITY): Payer: Self-pay | Admitting: Psychiatry

## 2016-05-01 ENCOUNTER — Ambulatory Visit (HOSPITAL_COMMUNITY): Payer: Self-pay | Admitting: Psychiatry

## 2016-05-06 ENCOUNTER — Telehealth (HOSPITAL_COMMUNITY): Payer: Self-pay

## 2016-05-06 DIAGNOSIS — F988 Other specified behavioral and emotional disorders with onset usually occurring in childhood and adolescence: Secondary | ICD-10-CM

## 2016-05-06 MED ORDER — DEXMETHYLPHENIDATE HCL ER 10 MG PO CP24: 10.0000 mg | ORAL_CAPSULE | Freq: Every day | ORAL | 0 refills | Status: DC

## 2016-05-06 NOTE — Telephone Encounter (Signed)
Medication management - Telephone call with patient to follow up on request for a new Focalin XR order.  Informed Dr. Lovena Le approved new 90 day order and patient can pick it up later this date.  Patient was rescheduled from 05/01/16 with Dr. Dwyane Dee due to inclement weather and is now scheduled to return on 38/18.  New 90 day Focalin XR 10 mg order prepared for 90 days as authorized by Dr. Lovena Le.  Order printed out and reviewed and signed by Dr. Lovena Le for patient to pick up.  Patient to call back when other refills are needed.

## 2016-05-24 ENCOUNTER — Other Ambulatory Visit (HOSPITAL_COMMUNITY): Payer: Self-pay | Admitting: Psychiatry

## 2016-05-24 DIAGNOSIS — F321 Major depressive disorder, single episode, moderate: Secondary | ICD-10-CM

## 2016-06-17 ENCOUNTER — Ambulatory Visit (INDEPENDENT_AMBULATORY_CARE_PROVIDER_SITE_OTHER): Payer: 59 | Admitting: Psychiatry

## 2016-06-17 ENCOUNTER — Encounter (HOSPITAL_COMMUNITY): Payer: Self-pay | Admitting: Psychiatry

## 2016-06-17 DIAGNOSIS — Z818 Family history of other mental and behavioral disorders: Secondary | ICD-10-CM

## 2016-06-17 DIAGNOSIS — Z87891 Personal history of nicotine dependence: Secondary | ICD-10-CM

## 2016-06-17 DIAGNOSIS — Z811 Family history of alcohol abuse and dependence: Secondary | ICD-10-CM

## 2016-06-17 DIAGNOSIS — F1290 Cannabis use, unspecified, uncomplicated: Secondary | ICD-10-CM

## 2016-06-17 DIAGNOSIS — F4310 Post-traumatic stress disorder, unspecified: Secondary | ICD-10-CM | POA: Diagnosis not present

## 2016-06-17 DIAGNOSIS — F900 Attention-deficit hyperactivity disorder, predominantly inattentive type: Secondary | ICD-10-CM | POA: Diagnosis not present

## 2016-06-17 DIAGNOSIS — Z79899 Other long term (current) drug therapy: Secondary | ICD-10-CM | POA: Diagnosis not present

## 2016-06-17 DIAGNOSIS — F988 Other specified behavioral and emotional disorders with onset usually occurring in childhood and adolescence: Secondary | ICD-10-CM

## 2016-06-17 DIAGNOSIS — F321 Major depressive disorder, single episode, moderate: Secondary | ICD-10-CM

## 2016-06-17 MED ORDER — CITALOPRAM HYDROBROMIDE 40 MG PO TABS: ORAL_TABLET | ORAL | 3 refills | Status: DC

## 2016-06-17 MED ORDER — DEXMETHYLPHENIDATE HCL ER 10 MG PO CP24: 10.0000 mg | ORAL_CAPSULE | Freq: Every day | ORAL | 0 refills | Status: DC

## 2016-06-17 MED ORDER — BENZTROPINE MESYLATE 1 MG PO TABS: 1.0000 mg | ORAL_TABLET | Freq: Every day | ORAL | 2 refills | Status: DC

## 2016-06-17 MED ORDER — QUETIAPINE FUMARATE 50 MG PO TABS: ORAL_TABLET | ORAL | 3 refills | Status: DC

## 2016-06-17 NOTE — Progress Notes (Signed)
Patient ID: Tiffany Hester, female   DOB: [birth year], 21 y.o.   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 She reports that her boyfriend is nice and supportive and adds that they're living with his parents at this time. She states that she's been doing well with her mood, anxiety and focus.  On a scale of 0-10, with 0 being no symptoms in 10 being the worst, patient reports that her depression and anxiety are both  1 out of 10.  She currently denies any aggravating factors and adds that her boyfriend, his family and her parents are supportive  In regards to her medications, patient states that she wants to stay on them as she is doing fairly well. She has that she still needs a Cogentin and does not want to make any changes in her medications. She reports that she's been taking her medications regularly and feels that she is stable.  In regards to her focus, patient states that she is able to stay focused and complete her work. She also denies any tobacco use , marijuana use, alcohol use or illicit drug use She denies any concerns at this visit, any safety issues  Review of Systems  Constitutional: Negative.  Negative for chills, fever, malaise/fatigue and weight loss.  HENT: Negative.  Negative for congestion, hearing loss and sore throat.   Eyes: Negative.  Negative for blurred vision, double vision, discharge and redness.  Respiratory: Negative.  Negative for cough, shortness of breath and wheezing.   Cardiovascular: Negative.  Negative for chest pain and palpitations.  Gastrointestinal: Negative.  Negative for  abdominal pain, constipation, diarrhea, heartburn, nausea and vomiting.  Genitourinary: Negative.  Negative for dysuria, flank pain, frequency and urgency.  Musculoskeletal: Negative.  Negative for falls, joint pain and myalgias.  Skin: Negative.  Negative for rash.  Neurological: Negative.  Negative for dizziness, tingling, tremors, seizures, loss of consciousness, weakness and headaches.  Endo/Heme/Allergies: Negative for environmental allergies.  Psychiatric/Behavioral: Negative.  Negative for depression, hallucinations, memory loss, substance abuse and suicidal ideas. The patient is not nervous/anxious and does not have insomnia.    Social History   Social History  . Marital status: Single    Spouse name: N/A  . Number of children: N/A  . Years of education: N/A   Occupational History  . Not on file.   Social History Main Topics  . Smoking status: Former Smoker    Packs/day: 1.00    Types: E-cigarettes, Cigarettes    Quit date: 02/13/2016  . Smokeless tobacco: Never Used     Comment: still using e cigg  . Alcohol use 0.0 oz/week     Comment: occ.  . Drug use: Yes    Types: Marijuana     Comment: no longer using  . Sexual activity: Yes    Birth control/ protection: Pill   Other Topics Concern  . Not on file   Social History Narrative  . No narrative on file   Patient is living with her boyfriend and his parents. She is working 30 hours a week.Her parents and younger sibling live in [place], [place] and are  supportive  Past Medical History:   Past Medical History:  Diagnosis Date  . Anxiety   . Asthma   . Panic attack 12/11/2012    Allergies:  None Current Medications:  Current Outpatient Prescriptions  Medication Sig Dispense Refill  . albuterol (PROAIR HFA) 108 (90 Base) MCG/ACT inhaler     . benztropine (COGENTIN) 1 MG tablet TAKE 1 TABLET BY MOUTH ONCE A DAY 60 tablet 2  . citalopram (CELEXA) 40 MG tablet TAKE 1 TABLET (40 MG TOTAL) BY MOUTH AT  BEDTIME. 30 tablet 3  . dexmethylphenidate (FOCALIN XR) 10 MG 24 hr capsule Take 1 capsule (10 mg total) by mouth daily. 90 capsule 0  . JUNEL FE 1/20 1-20 MG-MCG tablet     . QUEtiapine (SEROQUEL) 50 MG tablet TAKE 1 TABLET (50 MG TOTAL) BY MOUTH AT BEDTIME. 30 tablet 3   No current facility-administered medications for this visit.      Family History:   Family History  Problem Relation Age of Onset  . ADD / ADHD Brother   . Alcohol abuse Paternal Uncle    General Appearance: alert, oriented, no acute distress and well nourished Blood pressure 116/66, pulse 84, height 5\' 9"  (1.753 m), weight 135 lb 12.8 oz (61.6 kg). Musculoskeletal: Strength & Muscle Tone: within normal limits Gait & Station: normal Patient leans: N/A Mental Status Examination/Evaluation: Objective:  Appearance: Casual   Eye Contact::  Fair   Speech:  Clear and Coherent  Volume:  Normal  Mood:  Euthymic  Affect:  Congruent  Thought Process:  Goal Directed and Intact  Orientation:  Full (Time, Place, and Person)  Thought Content:  WDL  Suicidal Thoughts:  No  Homicidal Thoughts:  No  Judgement:  Fair   Insight:  Fair  Psychomotor Activity:  Normal  Akathisia:  NA  Handed:  Right  AIMS (if indicated):  N/A  Assets:  Desire for Improvement Housing Physical Health Social Set designer and fund of knowledge: good     Assessment:  Axis I: Major Depression, single episode and Post Traumatic Stress Disorder, ADHD inattentive type  AXIS I ADHD, inattentive type, Major Depression, single episode and Post Traumatic Stress Disorder  AXIS II Deferred  AXIS III Past Medical History:  Diagnosis Date  . Anxiety   . Asthma   . Panic attack 12/11/2012     AXIS IV educational problems and problems related to social environment  AXIS V 61-70 mild symptoms   Treatment Plan/Recommendations:  Plan of Care: Continue Seroquel 50 mg at bedtime for mood stabilization and depression Continue  Cogentin 1 mg daily for EPS Change  Celexa to 40 mg to bedtime to help with anxiety and depression.   Continue Focalin XR 10 mg 1 in the morning for ADD inattentive type.    Laboratory:   none at this time  Psychotherapy: Continue seeing Anderson [facility] for individual counseling     Routine PRN Medications:  Yes,Vistaril 25 mg 3 times a day when necessary anxiety/agitation    Safety Concerns:  None at this as patient    Other:  Call as necessary Follow up in 6 months   50% of this visit was spent in discussingMedications with patient, discussed discontinuing Cogentin and possibly discontinuing Seroquel. Patient states that she feels the medication that helped her significantly and she would like to stay on them. Also discussed with patient the need to return back to school part-time and patient states that she is looking into it. This visit was  of low medical complexity

## 2016-06-19 ENCOUNTER — Ambulatory Visit (HOSPITAL_COMMUNITY): Payer: Self-pay | Admitting: Psychiatry

## 2016-09-18 ENCOUNTER — Encounter (HOSPITAL_COMMUNITY): Payer: Self-pay | Admitting: Psychiatry

## 2016-09-18 ENCOUNTER — Ambulatory Visit (INDEPENDENT_AMBULATORY_CARE_PROVIDER_SITE_OTHER): Payer: 59 | Admitting: Psychiatry

## 2016-09-18 DIAGNOSIS — F4310 Post-traumatic stress disorder, unspecified: Secondary | ICD-10-CM

## 2016-09-18 DIAGNOSIS — Z87891 Personal history of nicotine dependence: Secondary | ICD-10-CM | POA: Diagnosis not present

## 2016-09-18 DIAGNOSIS — F321 Major depressive disorder, single episode, moderate: Secondary | ICD-10-CM | POA: Diagnosis not present

## 2016-09-18 DIAGNOSIS — Z818 Family history of other mental and behavioral disorders: Secondary | ICD-10-CM | POA: Diagnosis not present

## 2016-09-18 DIAGNOSIS — Z811 Family history of alcohol abuse and dependence: Secondary | ICD-10-CM

## 2016-09-18 DIAGNOSIS — F988 Other specified behavioral and emotional disorders with onset usually occurring in childhood and adolescence: Secondary | ICD-10-CM | POA: Diagnosis not present

## 2016-09-18 DIAGNOSIS — F909 Attention-deficit hyperactivity disorder, unspecified type: Secondary | ICD-10-CM

## 2016-09-18 DIAGNOSIS — J45990 Exercise induced bronchospasm: Secondary | ICD-10-CM | POA: Insufficient documentation

## 2016-09-18 DIAGNOSIS — F1290 Cannabis use, unspecified, uncomplicated: Secondary | ICD-10-CM

## 2016-09-18 MED ORDER — CITALOPRAM HYDROBROMIDE 40 MG PO TABS: ORAL_TABLET | ORAL | 3 refills | Status: DC

## 2016-09-18 MED ORDER — DEXMETHYLPHENIDATE HCL ER 10 MG PO CP24: 10.0000 mg | ORAL_CAPSULE | Freq: Every day | ORAL | 0 refills | Status: DC

## 2016-09-18 MED ORDER — BENZTROPINE MESYLATE 0.5 MG PO TABS: 0.5000 mg | ORAL_TABLET | Freq: Every day | ORAL | 0 refills | Status: DC

## 2016-09-18 MED ORDER — QUETIAPINE FUMARATE 25 MG PO TABS: ORAL_TABLET | ORAL | 0 refills | Status: DC
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
## 2016-10-23 ENCOUNTER — Ambulatory Visit (HOSPITAL_COMMUNITY): Payer: Self-pay | Admitting: Psychiatry

## 2016-11-03 ENCOUNTER — Encounter (HOSPITAL_COMMUNITY): Payer: Self-pay | Admitting: Psychiatry

## 2016-11-03 ENCOUNTER — Ambulatory Visit (INDEPENDENT_AMBULATORY_CARE_PROVIDER_SITE_OTHER): Payer: 59 | Admitting: Psychiatry

## 2016-11-03 DIAGNOSIS — Z87891 Personal history of nicotine dependence: Secondary | ICD-10-CM | POA: Diagnosis not present

## 2016-11-03 DIAGNOSIS — F321 Major depressive disorder, single episode, moderate: Secondary | ICD-10-CM | POA: Diagnosis not present

## 2016-11-03 DIAGNOSIS — Z818 Family history of other mental and behavioral disorders: Secondary | ICD-10-CM | POA: Diagnosis not present

## 2016-11-03 DIAGNOSIS — Z811 Family history of alcohol abuse and dependence: Secondary | ICD-10-CM

## 2016-11-03 DIAGNOSIS — F1290 Cannabis use, unspecified, uncomplicated: Secondary | ICD-10-CM

## 2016-11-03 DIAGNOSIS — F988 Other specified behavioral and emotional disorders with onset usually occurring in childhood and adolescence: Secondary | ICD-10-CM | POA: Diagnosis not present

## 2016-11-03 MED ORDER — CITALOPRAM HYDROBROMIDE 40 MG PO TABS: ORAL_TABLET | ORAL | 1 refills | Status: DC

## 2016-11-03 MED ORDER — DEXMETHYLPHENIDATE HCL ER 10 MG PO CP24: 10.0000 mg | ORAL_CAPSULE | Freq: Every day | ORAL | 0 refills | Status: DC
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
## 2017-01-07 ENCOUNTER — Encounter (HOSPITAL_COMMUNITY): Payer: Self-pay | Admitting: Psychiatry

## 2017-01-07 ENCOUNTER — Ambulatory Visit (INDEPENDENT_AMBULATORY_CARE_PROVIDER_SITE_OTHER): Payer: 59 | Admitting: Psychiatry

## 2017-01-07 DIAGNOSIS — F988 Other specified behavioral and emotional disorders with onset usually occurring in childhood and adolescence: Secondary | ICD-10-CM

## 2017-01-07 DIAGNOSIS — Z818 Family history of other mental and behavioral disorders: Secondary | ICD-10-CM

## 2017-01-07 DIAGNOSIS — F321 Major depressive disorder, single episode, moderate: Secondary | ICD-10-CM | POA: Diagnosis not present

## 2017-01-07 DIAGNOSIS — F4310 Post-traumatic stress disorder, unspecified: Secondary | ICD-10-CM | POA: Diagnosis not present

## 2017-01-07 DIAGNOSIS — F17210 Nicotine dependence, cigarettes, uncomplicated: Secondary | ICD-10-CM | POA: Diagnosis not present

## 2017-01-07 DIAGNOSIS — Z79899 Other long term (current) drug therapy: Secondary | ICD-10-CM | POA: Diagnosis not present

## 2017-01-07 DIAGNOSIS — F419 Anxiety disorder, unspecified: Secondary | ICD-10-CM

## 2017-01-07 MED ORDER — CITALOPRAM HYDROBROMIDE 40 MG PO TABS: ORAL_TABLET | ORAL | 1 refills | Status: DC

## 2017-01-07 MED ORDER — HYDROXYZINE HCL 25 MG PO TABS: 25.0000 mg | ORAL_TABLET | Freq: Three times a day (TID) | ORAL | 0 refills | Status: DC | PRN

## 2017-01-07 MED ORDER — DEXMETHYLPHENIDATE HCL ER 10 MG PO CP24: 10.0000 mg | ORAL_CAPSULE | Freq: Every day | ORAL | 0 refills | Status: DC

## 2017-01-07 NOTE — Progress Notes (Signed)
[place] MD/PA/NP OP Progress Note  01/07/2017 11:00 AM Tiffany Hester  MRN:  [id number]  Chief Complaint:  Chief Complaint    Follow-up     HPI: Tiffany Hester reports that her mood and anxiety are fairly stable. She has some anxiety related to her relationship, and continues to struggle with some of her taking on of her boyfriends anxiety and stressors when he comes home from work. She reports that she is not having any unsafe thoughts. She did not end up enrolling in [employer] college courses because she felt like she had too much on her plate between work and stressors at home. She wishes to remain on Celexa and Focalin at the current doses. She is agreeable to restarting therapy with Tiffany Hester in this office and follow-up with this Probation officer in 3 months..'s custom strategies for improving communication with her boyfriend, including 3 good things exercise.  Visit Diagnosis:    ICD-10-CM   1. 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 Major depressive disorder, single episode, moderate (HCC) F32.1 hydrOXYzine (ATARAX/VISTARIL) 25 MG tablet    citalopram (CELEXA) 40 MG tablet    Past Psychiatric History: See intake H&P for full details. Reviewed, with no updates at this time.   Past Medical History:  Past Medical History:  Diagnosis Date  . Anxiety   . Asthma   . Panic attack 12/11/2012    Past Surgical History:  Procedure Laterality Date  . TONSILLECTOMY      Family Psychiatric History: See intake H&P for full details. Reviewed, with no updates at this time.   Family History:  Family History  Problem Relation Age of Onset  . ADD / ADHD Brother   . Alcohol abuse Paternal Uncle     Social History:  Social History   Social History  . Marital status: Single    Spouse name: N/A  . Number of children: N/A  . Years of education: N/A   Social History Main Topics  . Smoking status: Current Every Day Smoker   Packs/day: 0.50    Years: 4.00    Types: Cigarettes    Last attempt to quit: 02/13/2016  . Smokeless tobacco: Never Used  . Alcohol use 1.2 oz/week    1 Glasses of wine, 1 Cans of beer per week     Comment: socially  . Drug use: No     Comment: no longer using  . Sexual activity: Yes    Partners: Male    Birth control/ protection: Pill   Other Topics Concern  . None   Social History Narrative  . None    Allergies:  Allergies  Allergen Reactions  . Tape     Metabolic Disorder Labs: No results found for: HGBA1C, MPG No results found for: PROLACTIN No results found for: CHOL, TRIG, HDL, CHOLHDL, VLDL, LDLCALC No results found for: TSH  Therapeutic Level Labs: No results found for: LITHIUM No results found for: VALPROATE No components found for:  CBMZ  Current Medications: Current Outpatient Prescriptions  Medication Sig Dispense Refill  . albuterol (PROAIR HFA) 108 (90 Base) MCG/ACT inhaler     . citalopram (CELEXA) 40 MG tablet TAKE 1 TABLET (40 MG TOTAL) BY MOUTH AT BEDTIME. 90 tablet 1  . dexmethylphenidate (FOCALIN XR) 10 MG 24 hr capsule Take 1 capsule (10 mg total) by mouth daily. 90 capsule 0  . JUNEL FE 1/20 1-20 MG-MCG tablet     . hydrOXYzine (ATARAX/VISTARIL)  25 MG tablet Take 1 tablet (25 mg total) by mouth 3 (three) times daily as needed. 30 tablet 0   No current facility-administered medications for this visit.      Musculoskeletal: Strength & Muscle Tone: within normal limits Gait & Station: normal Patient leans: N/A  Psychiatric Specialty Exam: ROS  Blood pressure 106/68, pulse 79, height 5' 9.25" (1.759 m), weight 147 lb (66.7 kg).Body mass index is 21.55 kg/m.  General Appearance: Casual and Well Groomed  Eye Contact:  Good  Speech:  Clear and Coherent  Volume:  Normal  Mood:  Dysphoric and Euthymic  Affect:  Congruent  Thought Process:  Goal Directed  Orientation:  Full (Time, Place, and Person)  Thought Content: Logical   Suicidal  Thoughts:  No  Homicidal Thoughts:  No  Memory:  Immediate;   Fair  Judgement:  Fair  Insight:  Fair  Psychomotor Activity:  Normal  Concentration:  Concentration: Fair  Recall:  Good  Fund of Knowledge: Good  Language: Good  Akathisia:  Negative  Handed:  Right  AIMS (if indicated): not done  Assets:  Communication Skills Desire for Improvement Financial Resources/Insurance Housing Intimacy Leisure Time Physical Health Social Support Transportation Vocational/Educational  ADL's:  Intact  Cognition: WNL  Sleep:  Good   Screenings:   Assessment and Plan: Tiffany Hester is a 21 year old female with PTSD and major depressive disorder, who presents today for medication management follow-up. She is fairly stable in terms of her mood symptoms with the current dose of Celexa and Focalin for ADHD. She has some stressors related to her relationship with her boyfriend. I believe she would benefit from interpersonal therapy and is agreeable to restart psychotherapy in this office.  1. Attention deficit disorder (ADD) without hyperactivity   2. Major depressive disorder, single episode, moderate (HCC)    Continue Focalin 10 mg daily Continue Celexa 40 mg daily Continue Vistaril 25 mg as needed for anxiety Return to clinic in 3 months   Aundra Dubin, MD 01/07/2017, 11:00 AM

## 2017-01-15 ENCOUNTER — Ambulatory Visit (HOSPITAL_COMMUNITY): Payer: Self-pay | Admitting: Psychiatry

## 2017-03-23 ENCOUNTER — Ambulatory Visit (HOSPITAL_COMMUNITY): Payer: Self-pay | Admitting: Psychiatry

## 2017-06-18 ENCOUNTER — Ambulatory Visit (HOSPITAL_COMMUNITY): Payer: Self-pay | Admitting: Psychiatry

## 2017-10-16 ENCOUNTER — Telehealth (HOSPITAL_COMMUNITY): Payer: Self-pay

## 2017-10-16 NOTE — Telephone Encounter (Signed)
Pharmacy called requesting a refill on Citalopram 40mg . Patient has no future appointment scheduled. Last office visit was 01-07-17. Please advise

## 2017-10-16 NOTE — Telephone Encounter (Signed)
No refills, she should coordinate with her primary care provider. Thank you!

## 2017-10-20 NOTE — Telephone Encounter (Signed)
Called and left voicemail message on pharmacy voicemail

## 2020-07-23 ENCOUNTER — Other Ambulatory Visit: Payer: Self-pay

## 2020-07-23 ENCOUNTER — Emergency Department (HOSPITAL_BASED_OUTPATIENT_CLINIC_OR_DEPARTMENT_OTHER)
Admission: EM | Admit: 2020-07-23 | Discharge: 2020-07-23 | Disposition: A | Discharge status: Home / Self Care | Payer: 59 | Attending: Emergency Medicine | Admitting: Emergency Medicine

## 2020-07-23 ENCOUNTER — Emergency Department (HOSPITAL_BASED_OUTPATIENT_CLINIC_OR_DEPARTMENT_OTHER): Payer: 59

## 2020-07-23 ENCOUNTER — Encounter (HOSPITAL_BASED_OUTPATIENT_CLINIC_OR_DEPARTMENT_OTHER): Payer: Self-pay | Admitting: Emergency Medicine

## 2020-07-23 DIAGNOSIS — N83201 Unspecified ovarian cyst, right side: Secondary | ICD-10-CM

## 2020-07-23 DIAGNOSIS — J45909 Unspecified asthma, uncomplicated: Secondary | ICD-10-CM | POA: Insufficient documentation

## 2020-07-23 DIAGNOSIS — R1031 Right lower quadrant pain: Secondary | ICD-10-CM | POA: Diagnosis present

## 2020-07-23 HISTORY — DX: Unspecified ovarian cyst, right side: N83.201

## 2020-07-23 HISTORY — DX: Unspecified asthma, uncomplicated: J45.909

## 2020-07-23 LAB — COMPREHENSIVE METABOLIC PANEL
ALT: 7 U/L (ref 0–44)
AST: 12 U/L — ABNORMAL LOW (ref 15–41)
Albumin: 4.8 g/dL (ref 3.5–5.0)
Alkaline Phosphatase: 55 U/L (ref 38–126)
Anion gap: 9 (ref 5–15)
BUN: 11 mg/dL (ref 6–20)
CO2: 23 mmol/L (ref 22–32)
Calcium: 9.4 mg/dL (ref 8.9–10.3)
Chloride: 107 mmol/L (ref 98–111)
Creatinine, Ser: 0.53 mg/dL (ref 0.44–1.00)
GFR, Estimated: >60 mL/min (ref >60)
Glucose, Bld: 91 mg/dL (ref 70–99)
Potassium: 3.8 mmol/L (ref 3.5–5.1)
Sodium: 139 mmol/L (ref 135–145)
Total Bilirubin: 0.3 mg/dL (ref 0.3–1.2)
Total Protein: 7.5 g/dL (ref 6.5–8.1)

## 2020-07-23 LAB — CBC WITH DIFFERENTIAL/PLATELET
Abs Immature Granulocytes: 0.05 10*3/uL (ref 0.00–0.07)
Basophils Absolute: 0 10*3/uL (ref 0.0–0.1)
Basophils Relative: 0 %
Eosinophils Absolute: 0 10*3/uL (ref 0.0–0.5)
Eosinophils Relative: 0 %
HCT: 42 % (ref 36.0–46.0)
Hemoglobin: 14.6 g/dL (ref 12.0–15.0)
Immature Granulocytes: 1 %
Lymphocytes Relative: 27 %
Lymphs Abs: 2.9 10*3/uL (ref 0.7–4.0)
MCH: 30.3 pg (ref 26.0–34.0)
MCHC: 34.8 g/dL (ref 30.0–36.0)
MCV: 87.1 fL (ref 80.0–100.0)
Monocytes Absolute: 0.8 10*3/uL (ref 0.1–1.0)
Monocytes Relative: 8 %
Neutro Abs: 7 10*3/uL (ref 1.7–7.7)
Neutrophils Relative %: 64 %
Platelets: 306 10*3/uL (ref 150–400)
RBC: 4.82 MIL/uL (ref 3.87–5.11)
RDW: 11.8 % (ref 11.5–15.5)
WBC: 10.8 10*3/uL — ABNORMAL HIGH (ref 4.0–10.5)
nRBC: 0 % (ref 0.0–0.2)

## 2020-07-23 LAB — URINALYSIS, ROUTINE W REFLEX MICROSCOPIC
Bilirubin Urine: NEGATIVE
Glucose, UA: NEGATIVE mg/dL
Ketones, ur: NEGATIVE mg/dL
Leukocytes,Ua: NEGATIVE
Nitrite: NEGATIVE
Specific Gravity, Urine: 1.031 — ABNORMAL HIGH (ref 1.005–1.030)
pH: 5.5 (ref 5.0–8.0)

## 2020-07-23 LAB — PREGNANCY, URINE: Preg Test, Ur: NEGATIVE

## 2020-07-23 LAB — LIPASE, BLOOD: Lipase: 24 U/L (ref 11–51)

## 2020-07-23 MED ORDER — HYDROCODONE-ACETAMINOPHEN 5-325 MG PO TABS: 1.0000 | ORAL_TABLET | Freq: Four times a day (QID) | ORAL | 0 refills | Status: DC | PRN

## 2020-07-23 MED ORDER — IBUPROFEN 800 MG PO TABS: 800.0000 mg | ORAL_TABLET | Freq: Three times a day (TID) | ORAL | 0 refills | Status: DC | PRN

## 2020-07-23 MED ORDER — IOHEXOL 300 MG/ML  SOLN
80.0000 mL | Freq: Once | INTRAMUSCULAR | Status: AC | PRN
  Administered 2020-07-23: 80 mL via INTRAVENOUS

## 2020-07-23 MED ORDER — IOHEXOL 300 MG/ML  SOLN: 100.0000 mL | Freq: Once | INTRAMUSCULAR | Status: DC | PRN

## 2020-07-23 NOTE — ED Provider Notes (Signed)
[facility]GENCY DEPT Provider Note   CSN: [id number] Arrival date & time: 07/23/20  1229     History Chief Complaint  Patient presents with  . Abdominal Pain    Tiffany Hester is a 25 y.o. female.  She is complaining of right lower quadrant abdominal pain that is been going on for a few months.  It was intermittent and she thought it was due to ovarian cyst.  Became more severe over the last 2 days.  Worse with movement and palpation.  No urinary symptoms.  No vaginal bleeding or discharge.  No nausea vomiting or diarrhea.  No fevers.  Had an IUD placed 2 weeks ago but does not feel it is related to that.  Rates the pain a 7 out of 10. The history is provided by the patient.  Abdominal Pain Pain location:  RLQ Pain quality: aching   Pain radiates to:  Does not radiate Pain severity:  Moderate Onset quality:  Gradual Duration: months. Timing:  Intermittent Progression:  Worsening Chronicity:  New Context: not trauma   Relieved by:  Nothing Worsened by:  Movement Ineffective treatments:  None tried Associated symptoms: no anorexia, no chest pain, no chills, no constipation, no cough, no diarrhea, no dysuria, no fever, no hematemesis, no hematochezia, no hematuria, no nausea, no shortness of breath, no sore throat, no vaginal bleeding and no vomiting        Past Medical History:  Diagnosis Date  . Asthma   . Bilateral ovarian cysts     There are no problems to display for this patient.   History reviewed. No pertinent surgical history.   OB History   No obstetric history on file.     History reviewed. No pertinent family history.  Social History   Tobacco Use  . Smoking status: Never Smoker  . Smokeless tobacco: Never Used  Substance Use Topics  . Alcohol use: Yes  . Drug use: Never    Home Medications Prior to Admission medications   Not on File    Allergies    Patient has no known allergies.  Review of Systems   Review of  Systems  Constitutional: Negative for chills and fever.  HENT: Negative for sore throat.   Eyes: Negative for visual disturbance.  Respiratory: Negative for cough and shortness of breath.   Cardiovascular: Negative for chest pain.  Gastrointestinal: Positive for abdominal pain. Negative for anorexia, constipation, diarrhea, hematemesis, hematochezia, nausea and vomiting.  Genitourinary: Negative for dysuria, hematuria and vaginal bleeding.  Musculoskeletal: Negative for neck pain.  Skin: Negative for rash.  Neurological: Negative for headaches.    Physical Exam Updated Vital Signs BP 117/85 (BP Location: Right Arm)   Pulse 74   Temp 98.9 F (37.2 C) (Oral)   Resp 16   Ht 5\' 9"  (1.753 m)   Wt 60.8 kg   SpO2 100%   BMI 19.79 kg/m   Physical Exam Vitals and nursing note reviewed.  Constitutional:      General: She is not in acute distress.    Appearance: Normal appearance. She is well-developed.  HENT:     Head: Normocephalic and atraumatic.  Eyes:     Conjunctiva/sclera: Conjunctivae normal.  Cardiovascular:     Rate and Rhythm: Normal rate and regular rhythm.     Heart sounds: No murmur heard.   Pulmonary:     Effort: Pulmonary effort is normal. No respiratory distress.     Breath sounds: Normal breath sounds.  Abdominal:  General: Abdomen is flat.     Palpations: Abdomen is soft.     Tenderness: There is abdominal tenderness in the right lower quadrant. There is no guarding or rebound.  Musculoskeletal:        General: No deformity or signs of injury. Normal range of motion.     Cervical back: Neck supple.  Skin:    General: Skin is warm and dry.  Neurological:     General: No focal deficit present.     Mental Status: She is alert.     ED Results / Procedures / Treatments   Labs (all labs ordered are listed, but only abnormal results are displayed) Labs Reviewed  URINALYSIS, ROUTINE W REFLEX MICROSCOPIC - Abnormal; Notable for the following components:       Result Value   Specific Gravity, Urine 1.031 (*)    Hgb urine dipstick SMALL (*)    Protein, ur TRACE (*)    All other components within normal limits  COMPREHENSIVE METABOLIC PANEL - Abnormal; Notable for the following components:   AST 12 (*)    All other components within normal limits  CBC WITH DIFFERENTIAL/PLATELET - Abnormal; Notable for the following components:   WBC 10.8 (*)    All other components within normal limits  PREGNANCY, URINE  LIPASE, BLOOD    EKG None  Radiology CT Abdomen Pelvis W Contrast  Result Date: 07/23/2020 CLINICAL DATA:  Lower right abdominal pain for several months. EXAM: CT ABDOMEN AND PELVIS WITH CONTRAST TECHNIQUE: Multidetector CT imaging of the abdomen and pelvis was performed using the standard protocol following bolus administration of intravenous contrast. CONTRAST:  80 mL OMNIPAQUE IOHEXOL 300 MG/ML  SOLN COMPARISON:  None. FINDINGS: Lower chest: Lung bases clear.  No pleural or pericardial effusion. Hepatobiliary: No focal liver abnormality is seen. No gallstones, gallbladder wall thickening, or biliary dilatation. Pancreas: Unremarkable. No pancreatic ductal dilatation or surrounding inflammatory changes. Spleen: Normal in size without focal abnormality. Adrenals/Urinary Tract: Adrenal glands are unremarkable. Kidneys are normal, without renal calculi, focal lesion, or hydronephrosis. Bladder is unremarkable. Stomach/Bowel: Stomach is within normal limits. Appendix appears normal. No evidence of bowel wall thickening, distention, or inflammatory changes. Vascular/Lymphatic: No significant vascular findings are present. No enlarged abdominal or pelvic lymph nodes. Reproductive: Patient has a right ovarian cystic lesion measuring 5.2 cm transverse by 5.1 cm AP by 5.2 cm craniocaudal. Left ovary appears normal. IUD is in place in the uterus. Other: None. Musculoskeletal: No fracture or worrisome lesion. There are small Schmorl's nodes in the lower  thoracic and upper lumbar spine. IMPRESSION: 5.2 cm simple appearing right ovarian cyst. No follow-up imaging recommended. Note: This recommendation does not apply to premenarchal patients and to those with increased risk (genetic, family history, elevated tumor markers or other high-risk factors) of ovarian cancer. Reference: JACR 2020 Feb; 17(2):248-254. Except as described above, the examination is negative. No acute abnormality is identified. Electronically Signed   By: Inge Rise M.D.   On: 07/23/2020 14:36    Procedures Procedures   Medications Ordered in ED Medications  iohexol (OMNIPAQUE) 300 MG/ML solution 80 mL (80 mLs Intravenous Contrast Given 07/23/20 1341)    ED Course  I have reviewed the triage vital signs and the nursing notes.  Pertinent labs & imaging results that were available during my care of the patient were reviewed by me and considered in my medical decision making (see chart for details).    MDM Rules/Calculators/A&P  This patient complains of right lower quadrant abdominal pain; this involves an extensive number of treatment Options and is a complaint that carries with it a high risk of complications and Morbidity. The differential includes appendicitis, ovarian cyst, ovarian torsion, constipation, musculoskeletal strain  I ordered, reviewed and interpreted labs, which included CBC with minimally elevated white count, normal hemoglobin, normal chemistries and LFTs, urinalysis with 11-20 reds no signs of infection, pregnancy test negative I ordered imaging studies which included CT abdomen and pelvis and I independently    visualized and interpreted imaging which showed a 5 cm simple appearing ovarian cyst on the right Previous records obtained and reviewed in epic, no recent admissions  After the interventions stated above, I reevaluated the patient and found patient still to have tenderness.  I reviewed the results of her work-up  with her.  She had just seen her OB not 2 weeks ago.  Recommended close follow-up with her OB/GYN.  Prescription for pain medicine given.  Return instructions discussed.   Final Clinical Impression(s) / ED Diagnoses Final diagnoses:  Right ovarian cyst    Rx / DC Orders ED Discharge Orders         Ordered    ibuprofen (ADVIL) 800 MG tablet  Every 8 hours PRN        07/23/20 1501    HYDROcodone-acetaminophen (NORCO/VICODIN) 5-325 MG tablet  Every 6 hours PRN        07/23/20 1523           Hayden Rasmussen, MD 07/23/20 1721

## 2020-07-23 NOTE — Discharge Instructions (Addendum)
You were seen in the emergency department for worsening right lower quadrant abdominal pain.  You had a CAT scan that did not show any signs of appendicitis.  You did have a large ovarian cyst and this will need close follow-up with your gynecologist.  You can use ibuprofen warm compress as needed.  Return to the emergency department for any worsening or concerning symptoms

## 2020-07-23 NOTE — ED Triage Notes (Addendum)
Lower rt abd pain x months ,  Tender to touch and move , no n/v/d, denies dysuria , states has hx of cysts and UTI , has new IUD x 2 weeks but pt states pain has been going on longer and worse with intercourse, denies vag d/c

## 2020-07-26 ENCOUNTER — Ambulatory Visit (HOSPITAL_COMMUNITY): Payer: 59 | Admitting: Certified Registered"

## 2020-07-26 ENCOUNTER — Encounter: Payer: Self-pay | Admitting: Obstetrics and Gynecology

## 2020-07-26 ENCOUNTER — Ambulatory Visit (HOSPITAL_COMMUNITY)
Admission: AD | Admit: 2020-07-26 | Discharge: 2020-07-26 | Disposition: A | Discharge status: Home / Self Care | Payer: 59 | Source: Other Acute Inpatient Hospital | Attending: Obstetrics and Gynecology | Admitting: Obstetrics and Gynecology

## 2020-07-26 ENCOUNTER — Encounter (HOSPITAL_COMMUNITY)
Admission: AD | Disposition: A | Discharge status: Home / Self Care | Payer: Self-pay | Attending: Obstetrics and Gynecology

## 2020-07-26 ENCOUNTER — Other Ambulatory Visit: Payer: Self-pay

## 2020-07-26 DIAGNOSIS — N83201 Unspecified ovarian cyst, right side: Secondary | ICD-10-CM

## 2020-07-26 DIAGNOSIS — Z8379 Family history of other diseases of the digestive system: Secondary | ICD-10-CM | POA: Diagnosis not present

## 2020-07-26 DIAGNOSIS — Z8249 Family history of ischemic heart disease and other diseases of the circulatory system: Secondary | ICD-10-CM | POA: Diagnosis not present

## 2020-07-26 DIAGNOSIS — Z800 Family history of malignant neoplasm of digestive organs: Secondary | ICD-10-CM | POA: Diagnosis not present

## 2020-07-26 DIAGNOSIS — D270 Benign neoplasm of right ovary: Secondary | ICD-10-CM | POA: Insufficient documentation

## 2020-07-26 DIAGNOSIS — R102 Pelvic and perineal pain: Secondary | ICD-10-CM | POA: Insufficient documentation

## 2020-07-26 DIAGNOSIS — Z79899 Other long term (current) drug therapy: Secondary | ICD-10-CM | POA: Diagnosis not present

## 2020-07-26 DIAGNOSIS — Z7951 Long term (current) use of inhaled steroids: Secondary | ICD-10-CM | POA: Insufficient documentation

## 2020-07-26 DIAGNOSIS — Z842 Family history of other diseases of the genitourinary system: Secondary | ICD-10-CM | POA: Insufficient documentation

## 2020-07-26 DIAGNOSIS — Z20822 Contact with and (suspected) exposure to covid-19: Secondary | ICD-10-CM | POA: Insufficient documentation

## 2020-07-26 DIAGNOSIS — Z841 Family history of disorders of kidney and ureter: Secondary | ICD-10-CM | POA: Diagnosis not present

## 2020-07-26 HISTORY — DX: Urinary tract infection, site not specified: N39.0

## 2020-07-26 HISTORY — DX: Unspecified ovarian cyst, right side: N83.201

## 2020-07-26 HISTORY — PX: LAPAROSCOPY: SHX197

## 2020-07-26 HISTORY — PX: OVARIAN CYST REMOVAL: SHX89

## 2020-07-26 HISTORY — DX: Anxiety disorder, unspecified: F41.9

## 2020-07-26 HISTORY — DX: Depression, unspecified: F32.A

## 2020-07-26 LAB — TYPE AND SCREEN
ABO/RH(D): O NEG
Antibody Screen: NEGATIVE

## 2020-07-26 LAB — CBC
HCT: 44.3 % (ref 36.0–46.0)
Hemoglobin: 14.8 g/dL (ref 12.0–15.0)
MCH: 30.5 pg (ref 26.0–34.0)
MCHC: 33.4 g/dL (ref 30.0–36.0)
MCV: 91.3 fL (ref 80.0–100.0)
Platelets: 290 10*3/uL (ref 150–400)
RBC: 4.85 MIL/uL (ref 3.87–5.11)
RDW: 11.8 % (ref 11.5–15.5)
WBC: 12.9 10*3/uL — ABNORMAL HIGH (ref 4.0–10.5)
nRBC: 0 % (ref 0.0–0.2)

## 2020-07-26 LAB — COMPREHENSIVE METABOLIC PANEL
ALT: 10 U/L (ref 0–44)
AST: 14 U/L — ABNORMAL LOW (ref 15–41)
Albumin: 4.3 g/dL (ref 3.5–5.0)
Alkaline Phosphatase: 54 U/L (ref 38–126)
Anion gap: 12 (ref 5–15)
BUN: 10 mg/dL (ref 6–20)
CO2: 20 mmol/L — ABNORMAL LOW (ref 22–32)
Calcium: 9.3 mg/dL (ref 8.9–10.3)
Chloride: 106 mmol/L (ref 98–111)
Creatinine, Ser: 0.55 mg/dL (ref 0.44–1.00)
GFR, Estimated: >60 mL/min (ref >60)
Glucose, Bld: 83 mg/dL (ref 70–99)
Potassium: 4 mmol/L (ref 3.5–5.1)
Sodium: 138 mmol/L (ref 135–145)
Total Bilirubin: 0.8 mg/dL (ref 0.3–1.2)
Total Protein: 7 g/dL (ref 6.5–8.1)

## 2020-07-26 LAB — ABO/RH: ABO/RH(D): O NEG

## 2020-07-26 LAB — SARS CORONAVIRUS 2 BY RT PCR (HOSPITAL ORDER, PERFORMED IN ~~LOC~~ HOSPITAL LAB): SARS Coronavirus 2: NEGATIVE

## 2020-07-26 SURGERY — EXCISION, CYST, OVARY
Anesthesia: General

## 2020-07-26 SURGERY — LAPAROSCOPY OPERATIVE
Anesthesia: General | Laterality: Right

## 2020-07-26 MED ORDER — MEPERIDINE HCL 25 MG/ML IJ SOLN: 6.2500 mg | INTRAMUSCULAR | Status: DC | PRN

## 2020-07-26 MED ORDER — MIDAZOLAM HCL 5 MG/5ML IJ SOLN
INTRAMUSCULAR | Status: DC | PRN
  Administered 2020-07-26: 2 mg via INTRAVENOUS

## 2020-07-26 MED ORDER — LACTATED RINGERS IV SOLN: INTRAVENOUS | Status: DC

## 2020-07-26 MED ORDER — ACETAMINOPHEN 10 MG/ML IV SOLN: 1000.0000 mg | Freq: Once | INTRAVENOUS | Status: DC | PRN

## 2020-07-26 MED ORDER — OXYCODONE HCL 5 MG PO TABS: 5.0000 mg | ORAL_TABLET | Freq: Four times a day (QID) | ORAL | 0 refills | Status: DC | PRN

## 2020-07-26 MED ORDER — FENTANYL CITRATE (PF) 250 MCG/5ML IJ SOLN
INTRAMUSCULAR | Status: AC
  Filled 2020-07-26: qty 5

## 2020-07-26 MED ORDER — FENTANYL CITRATE (PF) 100 MCG/2ML IJ SOLN
25.0000 ug | INTRAMUSCULAR | Status: DC | PRN
Start: 2020-07-26 — End: 2020-07-27
  Administered 2020-07-26: 50 ug via INTRAVENOUS

## 2020-07-26 MED ORDER — MIDAZOLAM HCL 2 MG/2ML IJ SOLN
INTRAMUSCULAR | Status: AC
  Filled 2020-07-26: qty 2

## 2020-07-26 MED ORDER — SCOPOLAMINE 1 MG/3DAYS TD PT72
MEDICATED_PATCH | TRANSDERMAL | Status: DC | PRN
  Administered 2020-07-26: 1 via TRANSDERMAL

## 2020-07-26 MED ORDER — ONDANSETRON HCL 4 MG/2ML IJ SOLN
INTRAMUSCULAR | Status: DC | PRN
  Administered 2020-07-26: 4 mg via INTRAVENOUS

## 2020-07-26 MED ORDER — POVIDONE-IODINE 10 % EX SWAB
2.0000 "application " | Freq: Once | CUTANEOUS | Status: AC
  Administered 2020-07-26: 2 via TOPICAL

## 2020-07-26 MED ORDER — SODIUM CHLORIDE (PF) 0.9 % IJ SOLN
INTRAMUSCULAR | Status: DC | PRN
  Administered 2020-07-26: 10 mL

## 2020-07-26 MED ORDER — ROCURONIUM BROMIDE 10 MG/ML (PF) SYRINGE
PREFILLED_SYRINGE | INTRAVENOUS | Status: DC | PRN
  Administered 2020-07-26: 60 mg via INTRAVENOUS

## 2020-07-26 MED ORDER — FENTANYL CITRATE (PF) 100 MCG/2ML IJ SOLN
INTRAMUSCULAR | Status: AC
  Filled 2020-07-26: qty 2

## 2020-07-26 MED ORDER — IBUPROFEN 800 MG PO TABS: 800.0000 mg | ORAL_TABLET | Freq: Three times a day (TID) | ORAL | 1 refills | Status: DC | PRN

## 2020-07-26 MED ORDER — BUPIVACAINE HCL (PF) 0.25 % IJ SOLN
INTRAMUSCULAR | Status: AC
  Filled 2020-07-26: qty 60

## 2020-07-26 MED ORDER — SCOPOLAMINE 1 MG/3DAYS TD PT72
MEDICATED_PATCH | TRANSDERMAL | Status: AC
  Filled 2020-07-26: qty 1

## 2020-07-26 MED ORDER — PROPOFOL 10 MG/ML IV BOLUS
INTRAVENOUS | Status: DC | PRN
  Administered 2020-07-26: 140 mg via INTRAVENOUS

## 2020-07-26 MED ORDER — SUGAMMADEX SODIUM 200 MG/2ML IV SOLN
INTRAVENOUS | Status: DC | PRN
  Administered 2020-07-26: 125 mg via INTRAVENOUS

## 2020-07-26 MED ORDER — SODIUM CHLORIDE 0.9 % IR SOLN
Status: DC | PRN
  Administered 2020-07-26: 2000 mL

## 2020-07-26 MED ORDER — SODIUM CHLORIDE (PF) 0.9 % IJ SOLN
INTRAMUSCULAR | Status: AC
  Filled 2020-07-26: qty 20

## 2020-07-26 MED ORDER — DEXAMETHASONE SODIUM PHOSPHATE 10 MG/ML IJ SOLN
INTRAMUSCULAR | Status: DC | PRN
  Administered 2020-07-26: 4 mg via INTRAVENOUS

## 2020-07-26 MED ORDER — LIDOCAINE 2% (20 MG/ML) 5 ML SYRINGE
INTRAMUSCULAR | Status: DC | PRN
  Administered 2020-07-26: 50 mg via INTRAVENOUS

## 2020-07-26 MED ORDER — ACETAMINOPHEN 500 MG PO TABS
1000.0000 mg | ORAL_TABLET | ORAL | Status: AC
  Administered 2020-07-26: 1000 mg via ORAL
  Filled 2020-07-26: qty 2

## 2020-07-26 MED ORDER — ACETAMINOPHEN 325 MG PO TABS: 325.0000 mg | ORAL_TABLET | Freq: Once | ORAL | Status: DC | PRN

## 2020-07-26 MED ORDER — BUPIVACAINE HCL (PF) 0.25 % IJ SOLN
INTRAMUSCULAR | Status: DC | PRN
  Administered 2020-07-26: 16 mL

## 2020-07-26 MED ORDER — ACETAMINOPHEN 160 MG/5ML PO SOLN: 325.0000 mg | Freq: Once | ORAL | Status: DC | PRN

## 2020-07-26 MED ORDER — FENTANYL CITRATE (PF) 100 MCG/2ML IJ SOLN
INTRAMUSCULAR | Status: DC | PRN
  Administered 2020-07-26 (×3): 50 ug via INTRAVENOUS

## 2020-07-26 MED ORDER — PROPOFOL 10 MG/ML IV BOLUS
INTRAVENOUS | Status: AC
  Filled 2020-07-26: qty 20

## 2020-07-26 SURGICAL SUPPLY — 30 items
CABLE HIGH FREQUENCY MONO STRZ (ELECTRODE) IMPLANT
COVER WAND RF STERILE (DRAPES) ×3 IMPLANT
DERMABOND ADVANCED (GAUZE/BANDAGES/DRESSINGS) ×1
DERMABOND ADVANCED .7 DNX12 (GAUZE/BANDAGES/DRESSINGS) ×2 IMPLANT
DRSG OPSITE POSTOP 3X4 (GAUZE/BANDAGES/DRESSINGS) ×3 IMPLANT
DURAPREP 26ML APPLICATOR (WOUND CARE) ×3 IMPLANT
GLOVE BIO SURGEON STRL SZ 6.5 (GLOVE) ×3 IMPLANT
GLOVE SURG UNDER POLY LF SZ7 (GLOVE) ×3 IMPLANT
GOWN STRL REUS W/ TWL LRG LVL3 (GOWN DISPOSABLE) ×4 IMPLANT
GOWN STRL REUS W/TWL LRG LVL3 (GOWN DISPOSABLE) ×2
KIT TURNOVER KIT B (KITS) ×3 IMPLANT
NEEDLE INSUFFLATION 14GA 120MM (NEEDLE) ×3 IMPLANT
NS IRRIG 1000ML POUR BTL (IV SOLUTION) ×3 IMPLANT
PACK LAPAROSCOPY BASIN (CUSTOM PROCEDURE TRAY) ×3 IMPLANT
PACK TRENDGUARD 450 HYBRID PRO (MISCELLANEOUS) ×2 IMPLANT
POUCH SPECIMEN RETRIEVAL 10MM (ENDOMECHANICALS) IMPLANT
PROTECTOR NERVE ULNAR (MISCELLANEOUS) ×6 IMPLANT
SET IRRIG TUBING LAPAROSCOPIC (IRRIGATION / IRRIGATOR) ×3 IMPLANT
SET TUBE SMOKE EVAC HIGH FLOW (TUBING) ×3 IMPLANT
SHEARS HARMONIC ACE PLUS 36CM (ENDOMECHANICALS) ×3 IMPLANT
SLEEVE ENDOPATH XCEL 5M (ENDOMECHANICALS) ×3 IMPLANT
SUT MNCRL AB 4-0 PS2 18 (SUTURE) ×3 IMPLANT
SUT VICRYL 0 UR6 27IN ABS (SUTURE) ×3 IMPLANT
SUT VICRYL 4-0 PS2 18IN ABS (SUTURE) ×3 IMPLANT
TOWEL GREEN STERILE FF (TOWEL DISPOSABLE) ×6 IMPLANT
TRAY FOLEY BAG SILVER LF 14FR (CATHETERS) ×3 IMPLANT
TRENDGUARD 450 HYBRID PRO PACK (MISCELLANEOUS) ×3
TROCAR XCEL NON-BLD 11X100MML (ENDOMECHANICALS) ×3 IMPLANT
TROCAR XCEL NON-BLD 5MMX100MML (ENDOMECHANICALS) ×3 IMPLANT
WARMER LAPAROSCOPE (MISCELLANEOUS) ×3 IMPLANT

## 2020-07-26 NOTE — Interval H&P Note (Signed)
History and Physical Interval Note:  07/26/2020 6:56 PM  Tiffany Hester  has presented today for surgery, with the diagnosis of pain in pelvis, cyst of ovary.  The various methods of treatment have been discussed with the patient and family. After consideration of risks, benefits and other options for treatment, the patient has consented to  Procedure(s): LAPAROSCOPY OPERATIVE (N/A) OVARIAN CYSTECTOMY (Right) LAPAROSCOPIC OOPHORECTOMY (Right) as a surgical intervention.  The patient's history has been reviewed, patient examined, no change in status, stable for surgery.  I have reviewed the patient's chart and labs.  Questions were answered to the patient's satisfaction.     Kendell Sagraves Bovard-Stuckert

## 2020-07-26 NOTE — Anesthesia Preprocedure Evaluation (Addendum)
Anesthesia Evaluation  Patient identified by MRN, date of birth, ID band Patient awake    Reviewed: Allergy & Precautions, NPO status , Patient's Chart, lab work & pertinent test results  Airway Mallampati: I  TM Distance: >3 FB Neck ROM: Full    Dental  (+) Teeth Intact, Dental Advisory Given   Pulmonary asthma ,    breath sounds clear to auscultation       Cardiovascular negative cardio ROS   Rhythm:Regular Rate:Normal     Neuro/Psych PSYCHIATRIC DISORDERS Anxiety Depression negative neurological ROS     GI/Hepatic negative GI ROS, Neg liver ROS,   Endo/Other  negative endocrine ROS  Renal/GU negative Renal ROS     Musculoskeletal negative musculoskeletal ROS (+)   Abdominal Normal abdominal exam  (+)   Peds  Hematology negative hematology ROS (+)   Anesthesia Other Findings   Reproductive/Obstetrics negative OB ROS                            Anesthesia Physical Anesthesia Plan  ASA: II  Anesthesia Plan: General   Post-op Pain Management:    Induction: Intravenous  PONV Risk Score and Plan: 4 or greater and Ondansetron, Dexamethasone, Midazolam and Scopolamine patch - Pre-op  Airway Management Planned: Oral ETT  Additional Equipment: None  Intra-op Plan:   Post-operative Plan: Extubation in OR  Informed Consent: I have reviewed the patients History and Physical, chart, labs and discussed the procedure including the risks, benefits and alternatives for the proposed anesthesia with the patient or authorized representative who has indicated his/her understanding and acceptance.     Dental advisory given  Plan Discussed with: CRNA  Anesthesia Plan Comments:        Anesthesia Quick Evaluation

## 2020-07-26 NOTE — H&P (Signed)
Tiffany Hester is an 25 y.o. female G0P0 with 6cm R ovarian cyst and pain.  Possibly intermittently torsed ovary.  D/W pt r/b/a of operative laparoscopy - ovarian cystectomy, possible oopherectomy.  D/W pt expectations and process of surgery.    Pertinent Gynecological History: G0P0 No abn pap, last 12/2018 No STD  Menstrual History: No LMP recorded.    Past Medical History:  Diagnosis Date  . Asthma   . Bilateral ovarian cysts   . Right ovarian cyst 07/26/2020    PSH: WTE, Tonsillectomy and Adneoidectomy  FH: HTN, MI, bone cyst, Crohn's disease, kidney stone, ovarian cyst, colon ca  Social History:  reports that she has never smoked. She has never used smokeless tobacco. She reports current alcohol use. She reports that she does not use drugs. married, [facility] and [facility]o  Allergies: No Known Allergies, adhesive  Meds: albuterol, allergy meds, vicodin, Kyleena IUD    Review of Systems  Constitutional: Negative.   HENT: Negative.   Respiratory: Negative.   Cardiovascular: Negative.   Gastrointestinal: Positive for abdominal pain.  Genitourinary: Negative.   Musculoskeletal: Negative.   Skin: Negative.   Neurological: Negative.   Psychiatric/Behavioral: Negative.     There were no vitals taken for this visit. Physical Exam Constitutional:      Appearance: Normal appearance. She is ill-appearing.  HENT:     Head: Normocephalic and atraumatic.  Cardiovascular:     Rate and Rhythm: Normal rate and regular rhythm.  Pulmonary:     Effort: Pulmonary effort is normal.     Breath sounds: Normal breath sounds.  Abdominal:     General: Bowel sounds are normal.     Palpations: Abdomen is soft.  Musculoskeletal:        General: Normal range of motion.     Cervical back: Normal range of motion and neck supple.  Skin:    General: Skin is warm and dry.  Neurological:     General: No focal deficit present.     Mental Status: She is oriented to  person, place, and time.  Psychiatric:        Mood and Affect: Mood normal.        Behavior: Behavior normal.   [place]: nl L ovary, R ovary - simple cyst 6 x 5.7 x 5cm R ovary; nl uterus w IUD   Assessment/Plan: 24yo G0P0 with R ovarian cyst D/w pt r/b/a of operative laparoscopy R ovarian cystectomy D/w pt process and expectations  Malaki Koury Bovard-Stuckert 07/26/2020, 12:02 PM

## 2020-07-26 NOTE — Transfer of Care (Signed)
Immediate Anesthesia Transfer of Care Note  Patient: Tiffany Hester  Procedure(s) Performed: LAPAROSCOPY OPERATIVE (N/A ) OVARIAN CYSTECTOMY (Right )  Patient Location: PACU  Anesthesia Type:General  Level of Consciousness: awake, drowsy and patient cooperative  Airway & Oxygen Therapy: Patient Spontanous Breathing  Post-op Assessment: Report given to RN and Post -op Vital signs reviewed and stable  Post vital signs: Reviewed and stable  Last Vitals:  Vitals Value Taken Time  BP 144/90 07/26/20 2107  Temp    Pulse 103 07/26/20 2107  Resp 15 07/26/20 2107  SpO2 100 % 07/26/20 2107  Vitals shown include unvalidated device data.  Last Pain:  Vitals:   07/26/20 1356  TempSrc: Oral  PainSc: 5          Complications: No complications documented.

## 2020-07-26 NOTE — Anesthesia Procedure Notes (Signed)
Procedure Name: Intubation Date/Time: 07/26/2020 7:26 PM Performed by: Oletta Lamas, CRNA Pre-anesthesia Checklist: Patient identified, Emergency Drugs available, Suction available and Patient being monitored Patient Re-evaluated:Patient Re-evaluated prior to induction Oxygen Delivery Method: Circle System Utilized Preoxygenation: Pre-oxygenation with 100% oxygen Induction Type: IV induction Ventilation: Mask ventilation without difficulty Laryngoscope Size: Mac and 3 Grade View: Grade I Tube type: Oral Tube size: 7.0 mm Number of attempts: 1 Airway Equipment and Method: Stylet and Oral airway Placement Confirmation: ETT inserted through vocal cords under direct vision,  positive ETCO2 and breath sounds checked- equal and bilateral Secured at: 23 cm Tube secured with: Tape Dental Injury: Teeth and Oropharynx as per pre-operative assessment

## 2020-07-26 NOTE — Brief Op Note (Signed)
07/26/2020  9:04 PM  PATIENT:  [name]  25 y.o. female  PRE-OPERATIVE DIAGNOSIS:  pain in pelvis, cyst of ovary  POST-OPERATIVE DIAGNOSIS:  PELVIC PAIN, RIGHT OVARIAN CYST - SIMPLE  PROCEDURE:  Procedure(s): LAPAROSCOPY OPERATIVE (N/A) OVARIAN CYSTECTOMY (Right)  SURGEON:  Surgeon(s) and Role:    * Bovard-Stuckert, Dyshon Philbin, MD - Primary  ANESTHESIA:   local and general  EBL: 20cc, IVF per anesthesia, uop 100cc clear, concentrated urine   BLOOD ADMINISTERED:none  DRAINS: none   LOCAL MEDICATIONS USED:  MARCAINE     SPECIMEN:  Source of Specimen:  R ovarian cyst wall  DISPOSITION OF SPECIMEN:  PATHOLOGY  COUNTS:  YES  TOURNIQUET:  * No tourniquets in log *  DICTATION: .Other Dictation: Dictation Number [id number]  PLAN OF CARE: Discharge to home after PACU  PATIENT DISPOSITION:  PACU - hemodynamically stable.   Delay start of Pharmacological VTE agent (>24hrs) due to surgical blood loss or risk of bleeding: not applicable

## 2020-07-27 ENCOUNTER — Encounter (HOSPITAL_COMMUNITY): Payer: Self-pay | Admitting: Obstetrics and Gynecology

## 2020-07-27 NOTE — Op Note (Signed)
NAMETAKARA, SERMONS MEDICAL RECORD NO: [id number] ACCOUNT NO: [id number] DATE OF BIRTH: [birth year] FACILITY: [facility] LOCATION: [facility] PHYSICIAN: Janyth Contes, MD  Operative Report   DATE OF PROCEDURE: 07/26/2020  PREOPERATIVE DIAGNOSES:  Pelvic pain, 6 cm cyst of right ovary.  POSTOPERATIVE DIAGNOSES:  Pelvic pain, 6 cm cyst of right ovary, s/p status post ovarian cystectomy.  PROCEDURE PERFORMED:  Operative laparoscopy, right ovarian cystectomy.  SURGEON:  Janyth Contes, MD  ANESTHESIA:  Local and general.  ESTIMATED BLOOD LOSS:  Approximately 20 mL.  IV FLUIDS:  Per anesthesia.  URINE OUTPUT:  100 mL clear urine at the end of the procedure.  COMPLICATIONS:  None.  PATHOLOGY:  Right ovarian cyst wall.  DESCRIPTION OF PROCEDURE:  After informed consent was reviewed with the patient and her family including risks, benefits and alternatives of the surgical procedure, she was transported to the operating room and placed on the table in supine position.   General anesthesia was induced and found to be adequate.  She was then placed in the Yell[place]in stirrups, prepped and draped in the normal sterile fashion.  Foley catheter was sterilely placed.  After an appropriate timeout was performed, using an  open-sided speculum, a Hulka manipulator was placed on her cervix.  Gloves and gown were changed and attention was turned to the abdominal portion of the case.  An approximately 1 cm infraumbilical incision was made using a Veress needle.  The peritoneum  was insufflated after passing the hanging drop test with an opening pressure of approximately 1 mmHg.  With direct visualization, a 5 mm trocar was placed.  A brief pelvic survey revealed a large-appearing right ovary with a simple cyst approximately  6-7 cm.  This was punctured and drained using a suction irrigator.  The cyst wall was then removed with the Harmonic scalpel, was made hemostatic with the Harmonic scalpel as  well.  Copious pelvic irrigation was performed.  The hemostasis was assured  under direct visualization.  Accessory trocars were placed on both the right and left under direct visualization after the abdominal wall was injected with Marcaine.  The trocars were removed.  Pneumoperitoneum was evacuated.  Figure-of-eight was placed  in the fascia where the 10 mm port was placed.  The incisions were closed with 4-0 Vicryl in a subcuticular fashion and then Dermabond was applied.  The patient tolerated the procedure well.  Sponge, lap and needle counts were correct x2 per the  operating room staff.   NIK D: 07/26/2020 9:10:12 pm T: 07/27/2020 7:29:00 am  JOB: [id number]/ [id number]

## 2020-07-30 ENCOUNTER — Encounter (HOSPITAL_COMMUNITY): Payer: Self-pay | Admitting: Psychiatry

## 2020-07-30 LAB — SURGICAL PATHOLOGY

## 2020-08-01 ENCOUNTER — Encounter (HOSPITAL_COMMUNITY): Payer: Self-pay | Admitting: Obstetrics and Gynecology

## 2020-08-03 NOTE — Anesthesia Postprocedure Evaluation (Signed)
Anesthesia Post Note  Patient: Tiffany Hester  Procedure(s) Performed: LAPAROSCOPY OPERATIVE (N/A ) OVARIAN CYSTECTOMY (Right )     Patient location during evaluation: PACU Anesthesia Type: General Level of consciousness: awake and alert Pain management: pain level controlled Vital Signs Assessment: post-procedure vital signs reviewed and stable Respiratory status: spontaneous breathing, nonlabored ventilation, respiratory function stable and patient connected to nasal cannula oxygen Cardiovascular status: blood pressure returned to baseline and stable Postop Assessment: no apparent nausea or vomiting Anesthetic complications: no   No complications documented.          Effie Berkshire

## 2020-08-06 ENCOUNTER — Encounter (HOSPITAL_COMMUNITY): Payer: Self-pay | Admitting: Obstetrics and Gynecology

## 2020-09-19 ENCOUNTER — Telehealth (INDEPENDENT_AMBULATORY_CARE_PROVIDER_SITE_OTHER): Payer: 59 | Admitting: Psychiatry

## 2020-09-19 ENCOUNTER — Encounter (HOSPITAL_COMMUNITY): Payer: Self-pay | Admitting: Psychiatry

## 2020-09-19 DIAGNOSIS — F5102 Adjustment insomnia: Secondary | ICD-10-CM | POA: Diagnosis not present

## 2020-09-19 DIAGNOSIS — F410 Panic disorder [episodic paroxysmal anxiety] without agoraphobia: Secondary | ICD-10-CM | POA: Diagnosis not present

## 2020-09-19 DIAGNOSIS — F411 Generalized anxiety disorder: Secondary | ICD-10-CM | POA: Diagnosis not present

## 2020-09-19 MED ORDER — HYDROXYZINE HCL 10 MG PO TABS: 10.0000 mg | ORAL_TABLET | Freq: Every evening | ORAL | 0 refills | Status: DC | PRN

## 2020-09-19 MED ORDER — CITALOPRAM HYDROBROMIDE 10 MG PO TABS
10.0000 mg | ORAL_TABLET | Freq: Every day | ORAL | 0 refills | Status: DC
Start: 2020-09-19 — End: 2020-10-24

## 2020-09-19 NOTE — Progress Notes (Signed)
Psychiatric Initial Adult Assessment   Patient Identification: Tiffany Hester MRN:  [id number] Date of Evaluation:  09/19/2020 Referral Source: primary care. Dr. Rosann Auerbach Chief Complaint:  establish care, anxiety Visit Diagnosis:    ICD-10-CM   1. Panic attack  F41.0   2. GAD (generalized anxiety disorder)  F41.1   3. Adjustment insomnia  F51.02   Virtual Visit via Video Note  I connected with Tiffany Hester on 09/19/20 at 11:00 AM EDT by a video enabled telemedicine application and verified that I am speaking with the correct person using two identifiers.  Location: Patient: home Provider: home office   I discussed the limitations of evaluation and management by telemedicine and the availability of in person appointments. The patient expressed understanding and agreed to proceed.     I discussed the assessment and treatment plan with the patient. The patient was provided an opportunity to ask questions and all were answered. The patient agreed with the plan and demonstrated an understanding of the instructions.   The patient was advised to call back or seek an in-person evaluation if the symptoms worsen or if the condition fails to improve as anticipated.  I provided 45 minutes of non-face-to-face time during this encounter.     History of Present Illness: 25 years old currently married Caucasian female referred by primary care physician to establish care for anxiety she works as a Art therapist  Patient has been diagnosed with depression anxiety and ADHD when younger seen Dr. Dwyane Dee she has been off medication for for 5 years has been on Seroquel citalopram in the past she kept citalopram for a while then tapered herself off.  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 and has started feeling anxious and panic-like symptoms recurrence of panic attacks and anxiety at times unreasonable relevant to the stress which if mostly her job she wanted to get back on medication went to urgent  care and started back on 10 mg for the last 1 month she is doing somewhat better anxiety attacks are better she is able to manage stress She has been on 40 mg when she was younger but she does not feel that he needs to be on a higher dose now she feels to milligrams is helping and wants to keep the dose the same with the option of increasing it if needed there is no reported side effects  She does not endorse hopelessness or depressive symptoms does not endorse that she feels sad on a day-to-day basis  Does not endorse psychotic symptoms does not endorse current or in the past manic symptoms  She has a good relationship with her husband she has a pet and family whom she is close to  She feels well stress is there because of being overbooked otherwise she does like her job  She has difficulty waking up and keeping a good night sleep she tries to drink coffee in the morning but overall sleep disturbance keeps her tired and also anxious at times during the daytime she was given hydroxyzine 50 mg but she felt the dose was high  Aggravating factors; disease job Production assistant, radio, financial stress Modifying factors; her husband, daughter, family   Duration diagnosed with depression and anxiety when younger Anxiety and panic attack duration for the last few months  Alcohol and drug abuse denies Denies past psychiatric admission or suicide attempt    Past Psychiatric History: anxiety, depression  Previous Psychotropic Medications: Yes   Substance Abuse History in the last 12 months:  No.  Consequences of Substance Abuse: NA  Past Medical History:  Past Medical History:  Diagnosis Date  . Anxiety   . Asthma   . Bilateral ovarian cysts   . Chronic UTI (urinary tract infection)   . Depression   . Panic attack 12/11/2012  . Right ovarian cyst 07/26/2020    Past Surgical History:  Procedure Laterality Date  . LAPAROSCOPY N/A 07/26/2020   Procedure: LAPAROSCOPY OPERATIVE;  Surgeon:  Janyth Contes, MD;  Location: [place];  Service: Gynecology;  Laterality: N/A;  . OVARIAN CYST REMOVAL Right 07/26/2020   Procedure: OVARIAN CYSTECTOMY;  Surgeon: Janyth Contes, MD;  Location: [place];  Service: Gynecology;  Laterality: Right;  . TONSILLECTOMY      Family Psychiatric History: brother: adhd, depression.   Family History:  Family History  Problem Relation Age of Onset  . ADD / ADHD Brother   . Alcohol abuse Paternal Uncle     Social History:   Social History   Socioeconomic History  . Marital status: Married    Spouse name: Not on file  . Number of children: Not on file  . Years of education: Not on file  . Highest education level: Not on file  Occupational History  . Not on file  Tobacco Use  . Smoking status: Never Smoker  . Smokeless tobacco: Never Used  Vaping Use  . Vaping Use: Every day  Substance and Sexual Activity  . Alcohol use: Yes    Comment: ocassionally  . Drug use: Never    Types: Marijuana    Comment: no longer using  . Sexual activity: Yes    Partners: Male    Birth control/protection: Pill  Other Topics Concern  . Not on file  Social History Narrative   ** Merged History Encounter **       Social Determinants of Health   Financial Resource Strain: Not on file  Food Insecurity: Not on file  Transportation Needs: Not on file  Physical Activity: Not on file  Stress: Not on file  Social Connections: Not on file    Additional Social History: grew up with parents, no abuse, one time incident with cousin, has gone thru counselling and treatment for depression when young   Allergies:   Allergies  Allergen Reactions  . Tape     Metabolic Disorder Labs: No results found for: HGBA1C, MPG No results found for: PROLACTIN No results found for: CHOL, TRIG, HDL, CHOLHDL, VLDL, LDLCALC No results found for: TSH  Therapeutic Level Labs: No results found for: LITHIUM No results found for: CBMZ No results found for:  VALPROATE  Current Medications: Current Outpatient Medications  Medication Sig Dispense Refill  . citalopram (CELEXA) 10 MG tablet Take 1 tablet (10 mg total) by mouth daily. 30 tablet 0  . hydrOXYzine (ATARAX/VISTARIL) 10 MG tablet Take 1 tablet (10 mg total) by mouth at bedtime as needed. 30 tablet 0  . albuterol (PROAIR HFA) 108 (90 Base) MCG/ACT inhaler     . dexmethylphenidate (FOCALIN XR) 10 MG 24 hr capsule Take 1 capsule (10 mg total) by mouth daily. 90 capsule 0  . ibuprofen (ADVIL) 800 MG tablet Take 1 tablet (800 mg total) by mouth every 8 (eight) hours as needed. 30 tablet 1  . JUNEL FE 1/20 1-20 MG-MCG tablet     . oxyCODONE (ROXICODONE) 5 MG immediate release tablet Take 1 tablet (5 mg total) by mouth every 6 (six) hours as needed for severe pain. 15 tablet 0   No  current facility-administered medications for this visit.      Psychiatric Specialty Exam: Review of Systems  Cardiovascular: Negative for chest pain.  Psychiatric/Behavioral: Positive for sleep disturbance. Negative for agitation.    There were no vitals taken for this visit.There is no height or weight on file to calculate BMI.  General Appearance: Casual  Eye Contact:  Fair  Speech:  Clear and Coherent  Volume:  Decreased  Mood:  Euthymic  Affect:  Constricted  Thought Process:  Goal Directed  Orientation:  Full (Time, Place, and Person)  Thought Content:  Logical  Suicidal Thoughts:  No  Homicidal Thoughts:  No  Memory:  Immediate;   Fair Recent;   Fair  Judgement:  Fair  Insight:  Good  Psychomotor Activity:  Normal  Concentration:  Concentration: Fair and Attention Span: Fair  Recall:  Good  Fund of Knowledge:Good  Language: Good  Akathisia:  No  Handed:   AIMS (if indicated):  not done  Assets:  Communication Skills Desire for Improvement Financial Resources/Insurance Physical Health  ADL's:  Intact  Cognition: WNL  Sleep:  variable to poor   Screenings: PHQ2-9   Flowsheet Row  Video Visit from 09/19/2020 in [facility]  PHQ-2 Total Score 0    Flowsheet Row Video Visit from 09/19/2020 in [facility] (Discharged) from 07/26/2020 in [facility] from 07/23/2020 in [facility]  C-SSRS RISK CATEGORY No Risk No Risk No Risk      Assessment and Plan: as follows  Panic attacks:  She is doing fair panic attacks have subsided she is tolerating citalopram 10 mg and does not want to increase it we will continue. Work on breathing techniques if needed Also discussed in case patient wants to be pregnant to consult with her OB/GYN or primary care physician Best option is not to be on any medication for the first trimester but risk-benefit ratio discussed  Job stress is there but she is managing it  Anxiety disorder; managing on citalopram 10 mg  Insomnia; reviewed sleep hygiene add hydroxyzine small dose of 10 mg  Follow-up in 4 weeks or earlier if needed   Merian Capron, MD 6/8/202211:38 AM

## 2020-10-24 ENCOUNTER — Encounter (HOSPITAL_COMMUNITY): Payer: Self-pay | Admitting: Psychiatry

## 2020-10-24 ENCOUNTER — Telehealth (INDEPENDENT_AMBULATORY_CARE_PROVIDER_SITE_OTHER): Payer: 59 | Admitting: Psychiatry

## 2020-10-24 DIAGNOSIS — F410 Panic disorder [episodic paroxysmal anxiety] without agoraphobia: Secondary | ICD-10-CM | POA: Diagnosis not present

## 2020-10-24 DIAGNOSIS — F5102 Adjustment insomnia: Secondary | ICD-10-CM | POA: Diagnosis not present

## 2020-10-24 DIAGNOSIS — F411 Generalized anxiety disorder: Secondary | ICD-10-CM | POA: Diagnosis not present

## 2020-10-24 MED ORDER — CITALOPRAM HYDROBROMIDE 10 MG PO TABS: 10.0000 mg | ORAL_TABLET | Freq: Every day | ORAL | 0 refills | Status: DC

## 2020-10-24 NOTE — Progress Notes (Signed)
[facility] Follow up visit  Patient Identification: Tiffany Hester MRN:  [id number] Date of Evaluation:  10/24/2020 Referral Source: primary care. Dr. Rosann Auerbach Chief Complaint: follow up  anxiety Visit Diagnosis:    ICD-10-CM   1. GAD (generalized anxiety disorder)  F41.1     2. Panic attack  F41.0     3. Adjustment insomnia  F51.02      Virtual Visit via Video Note  I connected with Tiffany Hester on 10/24/20 at  2:00 PM EDT by a video enabled telemedicine application and verified that I am speaking with the correct person using two identifiers.  Location: Patient: work Provider: home office   I discussed the limitations of evaluation and management by telemedicine and the availability of in person appointments. The patient expressed understanding and agreed to proceed.      I discussed the assessment and treatment plan with the patient. The patient was provided an opportunity to ask questions and all were answered. The patient agreed with the plan and demonstrated an understanding of the instructions.   The patient was advised to call back or seek an in-person evaluation if the symptoms worsen or if the condition fails to improve as anticipated.  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  She wants to continue celexa 10mg  for now Panic and anxiety manageable Dental office busy but handling it   Supportive family  Some sleep issues since not on hydroxyzine  Aggravating factors; busy job, financial stress Modifying factors; her husband, daughter, family     Past Psychiatric History: anxiety,  depression  Previous Psychotropic Medications: Yes   Substance Abuse History in the last 12 months:  No.  Consequences of Substance Abuse: NA  Past Medical History:  Past Medical History:  Diagnosis Date   Anxiety    Asthma    Bilateral ovarian cysts    Chronic UTI (urinary tract infection)    Depression    Panic attack 12/11/2012   Right ovarian cyst 07/26/2020    Past Surgical History:  Procedure Laterality Date   LAPAROSCOPY N/A 07/26/2020   Procedure: LAPAROSCOPY OPERATIVE;  Surgeon: Janyth Contes, MD;  Location: [place];  Service: Gynecology;  Laterality: N/A;   OVARIAN CYST REMOVAL Right 07/26/2020   Procedure: OVARIAN CYSTECTOMY;  Surgeon: Janyth Contes, MD;  Location: [facility];  Service: Gynecology;  Laterality: Right;   TONSILLECTOMY       Family History:  Family History  Problem Relation Age of Onset   ADD / ADHD Brother    Alcohol abuse Paternal Uncle     Social History:   Social History   Socioeconomic History   Marital status: Married    Spouse name: Not on file   Number of children: Not on file   Years of education: Not on file   Highest education level: Not on file  Occupational History   Not on file  Tobacco Use   Smoking status: Never   Smokeless tobacco: Never  Vaping Use   Vaping Use: Every day  Substance and Sexual Activity   Alcohol use: Yes    Comment: ocassionally   Drug use: Never  Types: Marijuana    Comment: no longer using   Sexual activity: Yes    Partners: Male    Birth control/protection: Pill  Other Topics Concern   Not on file  Social History Narrative   ** Merged History Encounter **       Social Determinants of Health   Financial Resource Strain: Not on file  Food Insecurity: Not on file  Transportation Needs: Not on file  Physical Activity: Not on file  Stress: Not on file  Social Connections: Not on file     Allergies:   Allergies  Allergen Reactions   Tape     Metabolic Disorder  Labs: No results found for: HGBA1C, MPG No results found for: PROLACTIN No results found for: CHOL, TRIG, HDL, CHOLHDL, VLDL, LDLCALC No results found for: TSH  Therapeutic Level Labs: No results found for: LITHIUM No results found for: CBMZ No results found for: VALPROATE  Current Medications: Current Outpatient Medications  Medication Sig Dispense Refill   albuterol (PROAIR HFA) 108 (90 Base) MCG/ACT inhaler      citalopram (CELEXA) 10 MG tablet Take 1 tablet (10 mg total) by mouth daily. 90 tablet 0   hydrOXYzine (ATARAX/VISTARIL) 10 MG tablet Take 1 tablet (10 mg total) by mouth at bedtime as needed. 30 tablet 0   ibuprofen (ADVIL) 800 MG tablet Take 1 tablet (800 mg total) by mouth every 8 (eight) hours as needed. 30 tablet 1   JUNEL FE 1/20 1-20 MG-MCG tablet      oxyCODONE (ROXICODONE) 5 MG immediate release tablet Take 1 tablet (5 mg total) by mouth every 6 (six) hours as needed for severe pain. 15 tablet 0   No current facility-administered medications for this visit.      Psychiatric Specialty Exam: Review of Systems  Cardiovascular:  Negative for chest pain.  Psychiatric/Behavioral:  Negative for agitation.    There were no vitals taken for this visit.There is no height or weight on file to calculate BMI.  General Appearance: Casual  Eye Contact:  Fair  Speech:  Clear and Coherent  Volume:  Decreased  Mood:  Euthymic  Affect:  Constricted  Thought Process:  Goal Directed  Orientation:  Full (Time, Place, and Person)  Thought Content:  Logical  Suicidal Thoughts:  No  Homicidal Thoughts:  No  Memory:  Immediate;   Fair Recent;   Fair  Judgement:  Fair  Insight:  Good  Psychomotor Activity:  Normal  Concentration:  Concentration: Fair and Attention Span: Fair  Recall:  Good  Fund of Knowledge:Good  Language: Good  Akathisia:  No  Handed:   AIMS (if indicated):  not done  Assets:  Communication Skills Desire for Improvement Financial  Resources/Insurance Physical Health  ADL's:  Intact  Cognition: WNL  Sleep:   variable to poor   Screenings: PHQ2-9    Flowsheet Row Video Visit from 09/19/2020 in [facility]  PHQ-2 Total Score 0      Flowsheet Row Video Visit from 10/24/2020 in [facility] Video Visit from 09/19/2020 in [facility] (Discharged) from 07/26/2020 in [facility] No Risk No Risk No Risk       Assessment and Plan: as follows Prior documentation reviewed  Panic attacks:  doing fair on celexa 10mg , discussed risk benefit will also dicuss with obgyn  Best option is not to be on any medication for the first  trimester but risk-benefit ratio discussed  Job stress is there but she is managing it  Anxiety disorder; doing fair on celexa   Insomnia; reviewed sleep hygiene , off hycroxyzine for now Fu 2 m or earlier if needed   Merian Capron, MD 7/13/20222:14 PM

## 2020-12-10 LAB — OB RESULTS CONSOLE GC/CHLAMYDIA
Chlamydia: NEGATIVE
Gonorrhea: NEGATIVE

## 2020-12-10 LAB — OB RESULTS CONSOLE ANTIBODY SCREEN: Antibody Screen: NEGATIVE

## 2020-12-10 LAB — OB RESULTS CONSOLE RUBELLA ANTIBODY, IGM: Rubella: IMMUNE

## 2020-12-10 LAB — OB RESULTS CONSOLE ABO/RH: RH Type: NEGATIVE

## 2020-12-10 LAB — OB RESULTS CONSOLE HEPATITIS B SURFACE ANTIGEN: Hepatitis B Surface Ag: NEGATIVE

## 2020-12-10 LAB — OB RESULTS CONSOLE VARICELLA ZOSTER ANTIBODY, IGG: Varicella: IMMUNE

## 2020-12-10 LAB — OB RESULTS CONSOLE HIV ANTIBODY (ROUTINE TESTING): HIV: NONREACTIVE

## 2020-12-10 LAB — OB RESULTS CONSOLE RPR: RPR: NONREACTIVE

## 2021-01-19 ENCOUNTER — Other Ambulatory Visit (HOSPITAL_COMMUNITY): Payer: Self-pay | Admitting: Psychiatry

## 2021-01-30 ENCOUNTER — Telehealth (INDEPENDENT_AMBULATORY_CARE_PROVIDER_SITE_OTHER): Payer: 59 | Admitting: Psychiatry

## 2021-01-30 ENCOUNTER — Other Ambulatory Visit: Payer: Self-pay

## 2021-01-30 ENCOUNTER — Encounter (HOSPITAL_COMMUNITY): Payer: Self-pay | Admitting: Psychiatry

## 2021-01-30 DIAGNOSIS — F411 Generalized anxiety disorder: Secondary | ICD-10-CM

## 2021-01-30 DIAGNOSIS — F410 Panic disorder [episodic paroxysmal anxiety] without agoraphobia: Secondary | ICD-10-CM

## 2021-01-30 NOTE — Progress Notes (Signed)
[place] Follow up visit  Patient Identification: Tiffany Hester MRN:  [id number] Date of Evaluation:  01/30/2021 Referral Source: primary care. Dr. Rosann Auerbach Chief Complaint: follow up  anxiety Visit Diagnosis:    ICD-10-CM   1. GAD (generalized anxiety disorder)  F41.1     2. Panic attack  F41.0      Virtual Visit via Video Note  I connected with Tiffany Hester on 01/30/21 at  1:30 PM EDT by a video enabled telemedicine application and verified that I am speaking with the correct person using two identifiers.  Location: Patient: work Provider: home office   I discussed the limitations of evaluation and management by telemedicine and the availability of in person appointments. The patient expressed understanding and agreed to proceed.    I discussed the assessment and treatment plan with the patient. The patient was provided an opportunity to ask questions and all were answered. The patient agreed with the plan and demonstrated an understanding of the instructions.   The patient was advised to call back or seek an in-person evaluation if the symptoms worsen or if the condition fails to improve as anticipated.  I provided 12 minutes of non-face-to-face time during this encounter.   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  Consequences of Substance Abuse: NA  Past Medical History:  Past Medical History:  Diagnosis Date   Anxiety    Asthma    Bilateral ovarian cysts    Chronic UTI (urinary tract infection)    Depression    Panic attack 12/11/2012   Right ovarian cyst 07/26/2020    Past Surgical History:  Procedure Laterality Date   LAPAROSCOPY N/A 07/26/2020   Procedure: LAPAROSCOPY OPERATIVE;  Surgeon: Janyth Contes, MD;  Location: [place];  Service: Gynecology;  Laterality: N/A;   OVARIAN CYST REMOVAL Right 07/26/2020   Procedure: OVARIAN CYSTECTOMY;  Surgeon: Janyth Contes, MD;  Location: [place];  Service: Gynecology;  Laterality: Right;   TONSILLECTOMY       Family History:  Family History  Problem Relation Age of Onset   ADD / ADHD Brother    Alcohol abuse Paternal Uncle     Social History:   Social History   Socioeconomic History   Marital status: Married    Spouse name: Not on file   Number of children: Not on file   Years of education: Not on file   Highest education level: Not on file  Occupational History   Not on file  Tobacco Use   Smoking status: Never   Smokeless tobacco: Never  Vaping Use   Vaping Use: Every day  Substance and Sexual Activity   Alcohol use: Yes    Comment: ocassionally   Drug use: Never    Types: Marijuana    Comment: no longer using   Sexual activity: Yes  Partners: Male    Birth control/protection: Pill  Other Topics Concern   Not on file  Social History Narrative   ** Merged History Encounter **       Social Determinants of Health   Financial Resource Strain: Not on file  Food Insecurity: Not on file  Transportation Needs: Not on file  Physical Activity: Not on file  Stress: Not on file  Social Connections: Not on file     Allergies:   Allergies  Allergen Reactions   Tape     Metabolic Disorder Labs: No results found for: HGBA1C, MPG No results found  for: PROLACTIN No results found for: CHOL, TRIG, HDL, CHOLHDL, VLDL, LDLCALC No results found for: TSH  Therapeutic Level Labs: No results found for: LITHIUM No results found for: CBMZ No results found for: VALPROATE  Current Medications: Current Outpatient Medications  Medication Sig Dispense Refill   albuterol (PROAIR HFA) 108 (90 Base) MCG/ACT inhaler      citalopram (CELEXA) 10 MG tablet TAKE 1 TABLET(10 MG) BY MOUTH DAILY 90 tablet 0   hydrOXYzine (ATARAX/VISTARIL) 10 MG tablet Take 1 tablet (10 mg total) by mouth at bedtime as needed. 30 tablet 0   ibuprofen (ADVIL) 800 MG tablet Take 1 tablet (800 mg total) by mouth every 8 (eight) hours as needed. 30 tablet 1   JUNEL FE 1/20 1-20 MG-MCG tablet      oxyCODONE (ROXICODONE) 5 MG immediate release tablet Take 1 tablet (5 mg total) by mouth every 6 (six) hours as needed for severe pain. 15 tablet 0   No current facility-administered medications for this visit.      Psychiatric Specialty Exam: Review of Systems  Cardiovascular:  Negative for chest pain.  Psychiatric/Behavioral:  Negative for agitation.    There were no vitals taken for this visit.There is no height or weight on file to calculate BMI.  General Appearance: Casual  Eye Contact:  Fair  Speech:  Clear and Coherent  Volume:  Decreased  Mood:  Euthymic  Affect:  Constricted  Thought Process:  Goal Directed  Orientation:  Full (Time, Place, and Person)  Thought Content:  Logical  Suicidal Thoughts:  No  Homicidal Thoughts:  No  Memory:  Immediate;   Fair Recent;   Fair  Judgement:  Fair  Insight:  Good  Psychomotor Activity:  Normal  Concentration:  Concentration: Fair and Attention Span: Fair  Recall:  Good  Fund of Knowledge:Good  Language: Good  Akathisia:  No  Handed:   AIMS (if indicated):  not done  Assets:  Communication Skills Desire for Improvement Financial Resources/Insurance Physical Health  ADL's:  Intact  Cognition: WNL  Sleep:    variable to poor   Screenings: PHQ2-9    Flowsheet Row Video Visit from 09/19/2020 in [facility]  PHQ-2 Total Score 0      Flowsheet Row Video Visit from 01/30/2021 in [facility] Video Visit from 10/24/2020 in [facility] Video Visit from 09/19/2020 in [facility] No Risk No Risk No Risk       Assessment and Plan: as follows Prior documentation reviewed  Panic attacks: Currently stable on citalopram 20 mg she can continue she is pregnant she understands the risk and benefits  Job stress is there but she is managing it  Anxiety disorder; manageable on citalopram  Insomnia; doing better on Unasyn  Follow-up in 3  months or earlier if needed Merian Capron, MD 10/19/20221:43 PM

## 2021-04-25 ENCOUNTER — Other Ambulatory Visit (HOSPITAL_COMMUNITY): Payer: Self-pay | Admitting: Psychiatry

## 2021-04-26 ENCOUNTER — Encounter (HOSPITAL_COMMUNITY): Payer: Self-pay | Admitting: Psychiatry

## 2021-04-26 ENCOUNTER — Telehealth (INDEPENDENT_AMBULATORY_CARE_PROVIDER_SITE_OTHER): Payer: 59 | Admitting: Psychiatry

## 2021-04-26 DIAGNOSIS — F5102 Adjustment insomnia: Secondary | ICD-10-CM | POA: Diagnosis not present

## 2021-04-26 DIAGNOSIS — F411 Generalized anxiety disorder: Secondary | ICD-10-CM

## 2021-04-26 DIAGNOSIS — F410 Panic disorder [episodic paroxysmal anxiety] without agoraphobia: Secondary | ICD-10-CM

## 2021-04-26 NOTE — Progress Notes (Signed)
[facility] Follow up visit  Patient Identification: Tiffany Hester MRN:  [id number] Date of Evaluation:  04/26/2021 Referral Source: primary care. Dr. Rosann Auerbach Chief Complaint: follow up  anxiety Visit Diagnosis:    ICD-10-CM   1. GAD (generalized anxiety disorder)  F41.1     2. 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 Panic attack  F41.0      Virtual Visit via Video Note  I connected with Tiffany Hester on 04/26/21 at 12:30 PM EST by a video enabled telemedicine application and verified that I am speaking with the correct person using two identifiers.  Location: Patient: home  Provider: home office   I discussed the limitations of evaluation and management by telemedicine and the availability of in person appointments. The patient expressed understanding and agreed to proceed.     I discussed the assessment and treatment plan with the patient. The patient was provided an opportunity to ask questions and all were answered. The patient agreed with the plan and demonstrated an understanding of the instructions.   The patient was advised to call back or seek an in-person evaluation if the symptoms worsen or if the condition fails to improve as anticipated.  I provided 15 minutes of non-face-to-face time during this encounter.   Merian Capron, MD     History of Present Illness: 26 years old currently married Caucasian female initially eferred by primary care physician to establish care for anxiety she works as a Art therapist  Pregnant 3rd trimester Doing fair, husband supportive Taking unasyn for sleep it helps  Dental office busy but handling it   Supportive family  Aggravating factors; there is a job, Biomedical engineer factors; her husband, daughter, family   Past Psychiatric History: anxiety, depression  Previous Psychotropic Medications: Yes   Substance Abuse History in the last 12 months:  No.  Consequences of Substance Abuse: NA  Past Medical History:  Past  Medical History:  Diagnosis Date   Anxiety    Asthma    Bilateral ovarian cysts    Chronic UTI (urinary tract infection)    Depression    Panic attack 12/11/2012   Right ovarian cyst 07/26/2020    Past Surgical History:  Procedure Laterality Date   LAPAROSCOPY N/A 07/26/2020   Procedure: LAPAROSCOPY OPERATIVE;  Surgeon: Janyth Contes, MD;  Location: [facility];  Service: Gynecology;  Laterality: N/A;   OVARIAN CYST REMOVAL Right 07/26/2020   Procedure: OVARIAN CYSTECTOMY;  Surgeon: Janyth Contes, MD;  Location: [facility];  Service: Gynecology;  Laterality: Right;   TONSILLECTOMY       Family History:  Family History  Problem Relation Age of Onset   ADD / ADHD Brother    Alcohol abuse Paternal Uncle     Social History:   Social History   Socioeconomic History   Marital status: Married    Spouse name: Not on file   Number of children: Not on file   Years of education: Not on file   Highest education level: Not on file  Occupational History   Not on file  Tobacco Use   Smoking status: Never   Smokeless tobacco: Never  Vaping Use   Vaping Use: Every day  Substance and Sexual Activity   Alcohol use: Yes    Comment: ocassionally   Drug use: Never    Types: Marijuana    Comment: no longer using   Sexual activity: Yes    Partners: Male    Birth control/protection: Pill  Other Topics Concern  Not on file  Social History Narrative   ** Merged History Encounter **       Social Determinants of Health   Financial Resource Strain: Not on file  Food Insecurity: Not on file  Transportation Needs: Not on file  Physical Activity: Not on file  Stress: Not on file  Social Connections: Not on file     Allergies:   Allergies  Allergen Reactions   Tape     Metabolic Disorder Labs: No results found for: HGBA1C, MPG No results found for: PROLACTIN No results found for: CHOL, TRIG, HDL, CHOLHDL, VLDL, LDLCALC No results found for: TSH  Therapeutic Level  Labs: No results found for: LITHIUM No results found for: CBMZ No results found for: VALPROATE  Current Medications: Current Outpatient Medications  Medication Sig Dispense Refill   albuterol (PROAIR HFA) 108 (90 Base) MCG/ACT inhaler      citalopram (CELEXA) 10 MG tablet TAKE 1 TABLET(10 MG) BY MOUTH DAILY 90 tablet 0   ibuprofen (ADVIL) 800 MG tablet Take 1 tablet (800 mg total) by mouth every 8 (eight) hours as needed. 30 tablet 1   JUNEL FE 1/20 1-20 MG-MCG tablet      oxyCODONE (ROXICODONE) 5 MG immediate release tablet Take 1 tablet (5 mg total) by mouth every 6 (six) hours as needed for severe pain. 15 tablet 0   No current facility-administered medications for this visit.      Psychiatric Specialty Exam: Review of Systems  Cardiovascular:  Negative for chest pain.  Psychiatric/Behavioral:  Negative for agitation and behavioral problems.    There were no vitals taken for this visit.There is no height or weight on file to calculate BMI.  General Appearance: Casual  Eye Contact:  Fair  Speech:  Clear and Coherent  Volume:  Decreased  Mood:  Euthymic  Affect:  Constricted  Thought Process:  Goal Directed  Orientation:  Full (Time, Place, and Person)  Thought Content:  Logical  Suicidal Thoughts:  No  Homicidal Thoughts:  No  Memory:  Immediate;   Fair Recent;   Fair  Judgement:  Fair  Insight:  Good  Psychomotor Activity:  Normal  Concentration:  Concentration: Fair and Attention Span: Fair  Recall:  Good  Fund of Knowledge:Good  Language: Good  Akathisia:  No  Handed:   AIMS (if indicated):  not done  Assets:  Communication Skills Desire for Improvement Financial Resources/Insurance Physical Health  ADL's:  Intact  Cognition: WNL  Sleep:   variable to poor   Screenings: PHQ2-9    Flowsheet Row Video Visit from 09/19/2020 in [facility]  PHQ-2 Total Score 0      Flowsheet Row Video Visit from 04/26/2021 in  [facility] Video Visit from 01/30/2021 in [facility] Video Visit from 10/24/2020 in [facility] No Risk No Risk No Risk       Assessment and Plan: as follows  Prior documentation reviewed   Panic attacks: stable on celexa, will continue  Job stress is there but she is managing it  Anxiety disorder; manageable on citalopram  Insomnia; now better on unasyn, can continue Meds reviewed and discussed  Follow-up in 3 months or earlier if needed Merian Capron, MD 1/13/202312:29 PM

## 2021-05-30 LAB — OB RESULTS CONSOLE GBS: GBS: NEGATIVE

## 2021-06-03 ENCOUNTER — Inpatient Hospital Stay (HOSPITAL_COMMUNITY): Payer: 59

## 2021-06-03 ENCOUNTER — Encounter (HOSPITAL_COMMUNITY): Payer: Self-pay

## 2021-06-03 ENCOUNTER — Inpatient Hospital Stay (HOSPITAL_COMMUNITY)
Admission: AD | Admit: 2021-06-03 | Discharge: 2021-06-03 | Disposition: A | Discharge status: Home / Self Care | Payer: 59 | Attending: Obstetrics & Gynecology | Admitting: Obstetrics & Gynecology

## 2021-06-03 ENCOUNTER — Other Ambulatory Visit: Payer: Self-pay

## 2021-06-03 DIAGNOSIS — O26899 Other specified pregnancy related conditions, unspecified trimester: Secondary | ICD-10-CM

## 2021-06-03 DIAGNOSIS — O26893 Other specified pregnancy related conditions, third trimester: Secondary | ICD-10-CM | POA: Diagnosis not present

## 2021-06-03 DIAGNOSIS — Z0130 Encounter for examination of blood pressure without abnormal findings: Secondary | ICD-10-CM

## 2021-06-03 DIAGNOSIS — Z3A36 36 weeks gestation of pregnancy: Secondary | ICD-10-CM | POA: Diagnosis not present

## 2021-06-03 DIAGNOSIS — J45909 Unspecified asthma, uncomplicated: Secondary | ICD-10-CM | POA: Insufficient documentation

## 2021-06-03 DIAGNOSIS — O10913 Unspecified pre-existing hypertension complicating pregnancy, third trimester: Secondary | ICD-10-CM | POA: Diagnosis not present

## 2021-06-03 DIAGNOSIS — R0602 Shortness of breath: Secondary | ICD-10-CM

## 2021-06-03 LAB — URINALYSIS, ROUTINE W REFLEX MICROSCOPIC
Bilirubin Urine: NEGATIVE
Glucose, UA: NEGATIVE mg/dL
Hgb urine dipstick: NEGATIVE
Ketones, ur: 5 mg/dL — AB
Nitrite: NEGATIVE
Protein, ur: NEGATIVE mg/dL
Specific Gravity, Urine: 1.024 (ref 1.005–1.030)
pH: 5 (ref 5.0–8.0)

## 2021-06-03 LAB — COMPREHENSIVE METABOLIC PANEL
ALT: 10 U/L (ref 0–44)
AST: 24 U/L (ref 15–41)
Albumin: 2.9 g/dL — ABNORMAL LOW (ref 3.5–5.0)
Alkaline Phosphatase: 188 U/L — ABNORMAL HIGH (ref 38–126)
Anion gap: 11 (ref 5–15)
BUN: 6 mg/dL (ref 6–20)
CO2: 20 mmol/L — ABNORMAL LOW (ref 22–32)
Calcium: 8.5 mg/dL — ABNORMAL LOW (ref 8.9–10.3)
Chloride: 105 mmol/L (ref 98–111)
Creatinine, Ser: 0.55 mg/dL (ref 0.44–1.00)
GFR, Estimated: >60 mL/min (ref >60)
Glucose, Bld: 114 mg/dL — ABNORMAL HIGH (ref 70–99)
Potassium: 3.5 mmol/L (ref 3.5–5.1)
Sodium: 136 mmol/L (ref 135–145)
Total Bilirubin: 0.4 mg/dL (ref 0.3–1.2)
Total Protein: 5.8 g/dL — ABNORMAL LOW (ref 6.5–8.1)

## 2021-06-03 LAB — PROTEIN / CREATININE RATIO, URINE
Creatinine, Urine: 159.25 mg/dL
Protein Creatinine Ratio: 0.07 mg/mg{Cre} (ref 0.00–0.15)
Total Protein, Urine: 11 mg/dL

## 2021-06-03 LAB — CBC
HCT: 37.5 % (ref 36.0–46.0)
Hemoglobin: 12.9 g/dL (ref 12.0–15.0)
MCH: 31.2 pg (ref 26.0–34.0)
MCHC: 34.4 g/dL (ref 30.0–36.0)
MCV: 90.8 fL (ref 80.0–100.0)
Platelets: 228 10*3/uL (ref 150–400)
RBC: 4.13 MIL/uL (ref 3.87–5.11)
RDW: 13.2 % (ref 11.5–15.5)
WBC: 12.2 10*3/uL — ABNORMAL HIGH (ref 4.0–10.5)
nRBC: 0 % (ref 0.0–0.2)

## 2021-06-03 MED ORDER — IOHEXOL 350 MG/ML SOLN
75.0000 mL | Freq: Once | INTRAVENOUS | Status: AC | PRN
  Administered 2021-06-03: 75 mL via INTRAVENOUS

## 2021-06-03 NOTE — MAU Provider Note (Signed)
History     CSN: [id number]  Arrival date and time: 06/03/21 1347   Event Date/Time   First Provider Initiated Contact with Patient 06/03/21 1457      Chief Complaint  Patient presents with   Shortness of Breath   Hypertension   Tachycardia   HPI  Tiffany Hester is a 26 y.o. female G1P0 @ [redacted]w[redacted]d here with SOB, concerns about BP, and tachycardia. She reports 1 week of SOB. The SOB does not keep her awake at night. She does report GERD that does keep her awake at night. She is currently taking protonix 20 mg daily. Her partner is here today and reports he noticed her breathing more rapidly last night while sleeping. She also seemed to be more restless.  Reports 1 eposide of right upper quadrant pain that was sharp and lasted 15 minutes today only. She does not have that pain currently. She has no headaches. Reports no elevated BP's in the office however does reports elevated BP's at home with home cuff.  Her normal BP is low and she is concerned with BP's that are 300'T systolic as this is much higher than her normal. + fetal movement. No bleeding.   OB History     Gravida  1   Para      Term      Preterm      AB      Living         SAB      IAB      Ectopic      Multiple      Live Births              Past Medical History:  Diagnosis Date   Anxiety    Asthma    Bilateral ovarian cysts    Chronic UTI (urinary tract infection)    Depression    Panic attack 12/11/2012   Right ovarian cyst 07/26/2020    Past Surgical History:  Procedure Laterality Date   LAPAROSCOPY N/A 07/26/2020   Procedure: LAPAROSCOPY OPERATIVE;  Surgeon: Janyth Contes, MD;  Location: [facility];  Service: Gynecology;  Laterality: N/A;   OVARIAN CYST REMOVAL Right 07/26/2020   Procedure: OVARIAN CYSTECTOMY;  Surgeon: Janyth Contes, MD;  Location: [facility];  Service: Gynecology;  Laterality: Right;   TONSILLECTOMY      Family History  Problem Relation Age of Onset    Hypertension Father    Crohn's disease Father    ADD / ADHD Brother    Alcohol abuse Paternal Uncle     Social History   Tobacco Use   Smoking status: Former    Types: Cigarettes   Smokeless tobacco: Never  Vaping Use   Vaping Use: Some days  Substance Use Topics   Alcohol use: Yes    Comment: ocassionally   Drug use: Never    Types: Marijuana    Comment: no longer using    Allergies:  Allergies  Allergen Reactions   Tape     Medications Prior to Admission  Medication Sig Dispense Refill Last Dose   citalopram (CELEXA) 10 MG tablet TAKE 1 TABLET(10 MG) BY MOUTH DAILY 90 tablet 0 06/02/2021 at 2030   clindamycin (CLINDAGEL) 1 % gel Apply topically 2 (two) times daily.   06/03/2021   doxylamine, Sleep, (UNISOM) 25 MG tablet Take 25 mg by mouth at bedtime as needed.   06/02/2021 at 2030   famotidine (PEPCID) 10 MG tablet Take 10 mg by mouth 2 (two)  times daily.   06/01/2021   pantoprazole (PROTONIX) 20 MG tablet Take 20 mg by mouth daily.   06/02/2021 at 2030   Prenatal Vit-Fe Fumarate-FA (MULTIVITAMIN-PRENATAL) 27-0.8 MG TABS tablet Take 1 tablet by mouth daily at 12 noon.   06/02/2021 at 23030   albuterol (PROAIR HFA) 108 (90 Base) MCG/ACT inhaler       ibuprofen (ADVIL) 800 MG tablet Take 1 tablet (800 mg total) by mouth every 8 (eight) hours as needed. 30 tablet 1    JUNEL FE 1/20 1-20 MG-MCG tablet       oxyCODONE (ROXICODONE) 5 MG immediate release tablet Take 1 tablet (5 mg total) by mouth every 6 (six) hours as needed for severe pain. 15 tablet 0    Results for orders placed or performed during the hospital encounter of 06/03/21 (from the past 48 hour(s))  Urinalysis, Routine w reflex microscopic Urine, Clean Catch     Status: Abnormal   Collection Time: 06/03/21  1:48 PM  Result Value Ref Range   Color, Urine YELLOW YELLOW   APPearance HAZY (A) CLEAR   Specific Gravity, Urine 1.024 1.005 - 1.030   pH 5.0 5.0 - 8.0   Glucose, UA NEGATIVE NEGATIVE mg/dL   Hgb urine  dipstick NEGATIVE NEGATIVE   Bilirubin Urine NEGATIVE NEGATIVE   Ketones, ur 5 (A) NEGATIVE mg/dL   Protein, ur NEGATIVE NEGATIVE mg/dL   Nitrite NEGATIVE NEGATIVE   Leukocytes,Ua SMALL (A) NEGATIVE   RBC / HPF 0-5 0 - 5 RBC/hpf   WBC, UA 0-5 0 - 5 WBC/hpf   Bacteria, UA RARE (A) NONE SEEN   Squamous Epithelial / LPF 0-5 0 - 5   Mucus PRESENT     Comment: Performed at [facility], [address] [place], [place] [address]  Protein / creatinine ratio, urine     Status: None   Collection Time: 06/03/21  2:31 PM  Result Value Ref Range   Creatinine, Urine 159.25 mg/dL   Total Protein, Urine 11 mg/dL    Comment: NO NORMAL RANGE ESTABLISHED FOR THIS TEST   Protein Creatinine Ratio 0.07 0.00 - 0.15 mg/mg[Cre]    Comment: Performed at [facility] [address], [place], [place] [address]  CBC     Status: Abnormal   Collection Time: 06/03/21  2:50 PM  Result Value Ref Range   WBC 12.2 (H) 4.0 - 10.5 K/uL   RBC 4.13 3.87 - 5.11 MIL/uL   Hemoglobin 12.9 12.0 - 15.0 g/dL   HCT 37.5 36.0 - 46.0 %   MCV 90.8 80.0 - 100.0 fL   MCH 31.2 26.0 - 34.0 pg   MCHC 34.4 30.0 - 36.0 g/dL   RDW 13.2 11.5 - 15.5 %   Platelets 228 150 - 400 K/uL   nRBC 0.0 0.0 - 0.2 %    Comment: Performed at [facility], [place] [address] [place], [place] [address]  Comprehensive metabolic panel     Status: Abnormal   Collection Time: 06/03/21  2:50 PM  Result Value Ref Range   Sodium 136 135 - 145 mmol/L   Potassium 3.5 3.5 - 5.1 mmol/L   Chloride 105 98 - 111 mmol/L   CO2 20 (L) 22 - 32 mmol/L   Glucose, Bld 114 (H) 70 - 99 mg/dL    Comment: Glucose reference range applies only to samples taken after fasting for at least 8 hours.   BUN 6 6 - 20 mg/dL   Creatinine,  Ser 0.55 0.44 - 1.00 mg/dL   Calcium 8.5 (L) 8.9 - 10.3 mg/dL   Total Protein 5.8 (L) 6.5 - 8.1 g/dL   Albumin 2.9 (L) 3.5 - 5.0 g/dL   AST 24 15 - 41 U/L   ALT 10 0 - 44 U/L   Alkaline Phosphatase 188 (H) 38 - 126 U/L    Total Bilirubin 0.4 0.3 - 1.2 mg/dL   GFR, Estimated >60 >60 mL/min    Comment: (NOTE) Calculated using the CKD-EPI Creatinine Equation (2021)    Anion gap 11 5 - 15    Comment: Performed at [facility], [place] [address], [place], [place] [address]     Review of Systems  Constitutional:  Negative for chills.  Eyes:  Negative for photophobia and visual disturbance.  Respiratory:  Positive for shortness of breath. Negative for cough, chest tightness, wheezing and stridor.   Cardiovascular:  Negative for chest pain and leg swelling.  Neurological:  Negative for headaches.  Physical Exam   Blood pressure 131/81, pulse 85, temperature 97.9 F (36.6 C), temperature source Oral, resp. rate 17, height 5\' 9"  (1.753 m), weight 79.6 kg, last menstrual period 06/27/2020, SpO2 99 %.  Patient Vitals for the past 24 hrs:  BP Temp Temp src Pulse Resp SpO2 Height Weight  06/03/21 1850 112/71 97.8 F (36.6 C) Oral 76 19 100 % -- --  06/03/21 1700 106/73 -- -- 76 -- 99 % -- --  06/03/21 1630 113/69 -- -- 77 -- 99 % -- --  06/03/21 1600 121/76 -- -- 75 -- 99 % -- --  06/03/21 1545 120/69 -- -- 74 -- 98 % -- --  06/03/21 1530 120/74 -- -- 78 -- 100 % -- --  06/03/21 1515 122/77 -- -- 82 -- 99 % -- --  06/03/21 1500 128/69 -- -- 83 -- 99 % -- --  06/03/21 1445 137/78 -- -- 87 -- 99 % -- --  06/03/21 1430 135/81 -- -- 91 -- 99 % -- --  06/03/21 1427 131/81 97.9 F (36.6 C) Oral 85 17 99 % -- --  06/03/21 1403 133/77 97.6 F (36.4 C) Oral 84 (!) 24 99 % -- --  06/03/21 1358 -- -- -- -- -- -- 5\' 9"  (1.753 m) 79.6 kg     Physical Exam Constitutional:      General: She is not in acute distress.    Appearance: She is well-developed. She is not ill-appearing, toxic-appearing or diaphoretic.  HENT:     Head: Normocephalic.  Eyes:     Pupils: Pupils are equal, round, and reactive to light.  Cardiovascular:     Rate and Rhythm: Normal rate and regular rhythm.  Pulmonary:     Effort: Pulmonary  effort is normal. Tachypnea present. No accessory muscle usage or respiratory distress.     Breath sounds: Normal breath sounds. No decreased air movement. No decreased breath sounds, wheezing, rhonchi or rales.  Musculoskeletal:        General: Normal range of motion.  Skin:    General: Skin is warm.     Capillary Refill: Capillary refill takes more than 3 seconds.  Neurological:     Mental Status: She is alert.  Psychiatric:        Mood and Affect: Mood is anxious.   Fetal Tracing: Baseline: 125 bpm Variability: Moderate  Accelerations: 15x15 Decelerations: None Toco: UI  MAU Course  Procedures None  MDM  All BP readings WNL. EKG normal with normal  sinus rhythm CT for PE evaluation negative PIH labs reassuring. Confirmed follow up with Dr. Benjie Karvonen is scheduled this Wednesday.   Assessment and Plan   A:  1. Shortness of breath due to pregnancy   2. [redacted] weeks gestation of pregnancy   3. BP check      P:  Discharge home Follow up Wednesday in the office as scheduled Return to MAU if symptoms worsen REST Increase fluid intake  Maleeha Halls, Artist Pais, NP 06/03/2021 7:32 PM

## 2021-06-03 NOTE — MAU Note (Signed)
Tiffany Hester is a 26 y.o. at [redacted]w[redacted]d here in MAU reporting: tachycardia for the past couple of days. States BP has been high, has been 140/80. SOB. No pain currently, no bleeding or LOF. Unsure about FM, states not paying attention to it.  Onset of complaint: ongoing  Pain score: 0/10  Vitals:   06/03/21 1403  BP: 133/77  Pulse: 84  Resp: (!) 24  Temp: 97.6 F (36.4 C)  SpO2: 99%     FHT:162  Lab orders placed from triage: UA

## 2021-06-14 ENCOUNTER — Encounter (HOSPITAL_COMMUNITY): Payer: Self-pay | Admitting: *Deleted

## 2021-06-14 ENCOUNTER — Telehealth (HOSPITAL_COMMUNITY): Payer: Self-pay | Admitting: *Deleted

## 2021-06-14 ENCOUNTER — Other Ambulatory Visit: Payer: Self-pay | Admitting: Obstetrics & Gynecology

## 2021-06-14 NOTE — Telephone Encounter (Signed)
Preadmission screen  

## 2021-06-17 ENCOUNTER — Other Ambulatory Visit: Payer: Self-pay | Admitting: Obstetrics & Gynecology

## 2021-06-17 LAB — SARS CORONAVIRUS 2 (TAT 6-24 HRS): SARS Coronavirus 2: NEGATIVE

## 2021-06-18 ENCOUNTER — Encounter (HOSPITAL_COMMUNITY): Payer: Self-pay | Admitting: Obstetrics and Gynecology

## 2021-06-18 ENCOUNTER — Inpatient Hospital Stay (HOSPITAL_BASED_OUTPATIENT_CLINIC_OR_DEPARTMENT_OTHER): Payer: 59

## 2021-06-18 ENCOUNTER — Inpatient Hospital Stay (EMERGENCY_DEPARTMENT_HOSPITAL)
Admission: AD | Admit: 2021-06-18 | Discharge: 2021-06-18 | Disposition: A | Discharge status: Home / Self Care | Payer: 59 | Source: Home / Self Care | Attending: Obstetrics and Gynecology | Admitting: Obstetrics and Gynecology

## 2021-06-18 DIAGNOSIS — Z0371 Encounter for suspected problem with amniotic cavity and membrane ruled out: Secondary | ICD-10-CM

## 2021-06-18 DIAGNOSIS — O471 False labor at or after 37 completed weeks of gestation: Secondary | ICD-10-CM | POA: Insufficient documentation

## 2021-06-18 DIAGNOSIS — Z3A38 38 weeks gestation of pregnancy: Secondary | ICD-10-CM

## 2021-06-18 DIAGNOSIS — N898 Other specified noninflammatory disorders of vagina: Secondary | ICD-10-CM

## 2021-06-18 DIAGNOSIS — O26893 Other specified pregnancy related conditions, third trimester: Secondary | ICD-10-CM

## 2021-06-18 DIAGNOSIS — O99344 Other mental disorders complicating childbirth: Secondary | ICD-10-CM | POA: Diagnosis not present

## 2021-06-18 DIAGNOSIS — R0602 Shortness of breath: Secondary | ICD-10-CM | POA: Diagnosis not present

## 2021-06-18 LAB — AMNISURE RUPTURE OF MEMBRANE (ROM) NOT AT ARMC: Amnisure ROM: NEGATIVE

## 2021-06-18 NOTE — MAU Provider Note (Signed)
?History  ?  ? ?[id number] ? ?Arrival date and time: 06/18/21 1052 ?  ? ?Chief Complaint  ?Patient presents with  ? Rupture of Membranes  ? ? ? ?HPI ?Tiffany Hester is a 26 y.o. at 42w5dwho presents from the office for evaluation of possible rupture of membranes. ?Patient reports a gush of clear fluid that occurred last night around 8 pm. This morning she felt fluid running down her leg. Reports 1 painful contractions this morning. Denies vaginal bleeding. Reports good fetal movement.   ? ?OB History   ? ? Gravida  ?1  ? Para  ?   ? Term  ?   ? Preterm  ?   ? AB  ?   ? Living  ?   ?  ? ? SAB  ?   ? IAB  ?   ? Ectopic  ?   ? Multiple  ?   ? Live Births  ?   ?   ?  ?  ? ? ?Past Medical History:  ?Diagnosis Date  ? Anxiety   ? Asthma   ? Bilateral ovarian cysts   ? Chronic UTI (urinary tract infection)   ? Depression   ? Panic attack 12/11/2012  ? Pregnancy induced hypertension   ? Right ovarian cyst 07/26/2020  ? ? ?Past Surgical History:  ?Procedure Laterality Date  ? LAPAROSCOPY N/A 07/26/2020  ? Procedure: LAPAROSCOPY OPERATIVE;  Surgeon: BJanyth Contes MD;  Location[place] [facility]  Service: Gynecology;  Laterality: N/A;  ? OVARIAN CYST REMOVAL Right 07/26/2020  ? Procedure: OVARIAN CYSTECTOMY;  Surgeon: BJanyth Contes MD;  Location: [facility]  Service: Gynecology;  Laterality: Right;  ? TONSILLECTOMY    ? ? ?Family History  ?Problem Relation Age of Onset  ? Hypertension Father   ? Crohn's disease Father   ? ADD / ADHD Brother   ? Heart attack Paternal Uncle   ? Alcohol abuse Paternal Uncle   ? Heart attack Paternal Grandfather   ? ? ?Allergies  ?Allergen Reactions  ? Tape   ? ? ?No current facility-administered medications on file prior to encounter.  ? ?Current Outpatient Medications on File Prior to Encounter  ?Medication Sig Dispense Refill  ? labetalol (NORMODYNE) 100 MG tablet Take 50 mg by mouth 2 (two) times daily. 50 mg x 2    ? albuterol (PROAIR HFA) 108 (90 Base) MCG/ACT inhaler     ? citalopram  (CELEXA) 10 MG tablet TAKE 1 TABLET(10 MG) BY MOUTH DAILY 90 tablet 0  ? clindamycin (CLINDAGEL) 1 % gel Apply topically 2 (two) times daily.    ? doxylamine, Sleep, (UNISOM) 25 MG tablet Take 25 mg by mouth at bedtime as needed.    ? famotidine (PEPCID) 10 MG tablet Take 10 mg by mouth 2 (two) times daily.    ? pantoprazole (PROTONIX) 20 MG tablet Take 20 mg by mouth daily.    ? Prenatal Vit-Fe Fumarate-FA (MULTIVITAMIN-PRENATAL) 27-0.8 MG TABS tablet Take 1 tablet by mouth daily at 12 noon.    ? [DISCONTINUED] dexmethylphenidate (FOCALIN XR) 10 MG 24 hr capsule Take 1 capsule (10 mg total) by mouth daily. 90 capsule 0  ? ? ? ?ROS ?Pertinent positives and negative per HPI, all others reviewed and negative ? ?Physical Exam  ? ?BP 120/77 (BP Location: Right Arm)   Pulse 90   Temp 97.8 ?F (36.6 ?C) (Oral)   Resp 16   LMP 06/27/2020  ? ?Patient Vitals for the past 24 hrs: ? BP  Temp Temp src Pulse Resp  ?06/18/21 1119 120/77 97.8 ?F (36.6 ?C) Oral 90 16  ? ? ?Physical Exam ?Vitals and nursing note reviewed.  ?Constitutional:   ?   Appearance: Normal appearance.  ?Eyes:  ?   General: No scleral icterus. ?   Conjunctiva/sclera: Conjunctivae normal.  ?   Pupils: Pupils are equal, round, and reactive to light.  ?Pulmonary:  ?   Effort: Pulmonary effort is normal. No respiratory distress.  ?Neurological:  ?   Mental Status: She is alert.  ?Psychiatric:     ?   Mood and Affect: Mood normal.     ?   Behavior: Behavior normal.  ?  ?FHT ?Baseline 140, moderate variability, 15x15 accels, no decels ?Toco: x1 ?Cat: 1 ? ?Labs ?Results for orders placed or performed during the hospital encounter of 06/18/21 (from the past 24 hour(s))  ?Amnisure rupture of membrane (rom)not at [facility]     Status: None  ? Collection Time: 06/18/21 11:40 AM  ?Result Value Ref Range  ? Amnisure ROM NEGATIVE   ? ? ?Imaging ?No results found. ? ?MAU Course  ?Procedures ?Lab Orders    ?     Amnisure rupture of membrane (rom)not at [facility]    ?No orders of the  defined types were placed in this encounter. ? ?Imaging Orders    ?     [place] MFM OB LIMITED    ? ? ?MDM ?Patient sent from the office for continued evaluation of possible rupture of membranes. Dr. Benjie Karvonen called prior to patient's arrival - had speculum exam in the office with no pooling. Patient is scheduled for induction on Thursday.  ? ?Amnisure negative & normal AFI per ultrasound. Reactive tracing. Discussed results with patient. ? ?Reviewed [facility] prenatal records under media tab. GBS negative.  ?Assessment and Plan  ? ?1. Encounter for suspected PROM, with rupture of membranes not found   ?2. [redacted] weeks gestation of pregnancy   ?3. Vaginal discharge during pregnancy in third trimester   ? ?-Discharge home ?-Reviewed labor precautions & reasons to return to MAU ?-Return for scheduled induction on Thursday morning ? ?Jorje Guild, NP ?06/18/21 ?1:17 PM ? ? ?

## 2021-06-18 NOTE — MAU Note (Signed)
Patient arrived from [facility] office complaining of leakage of fluid that started last night. She denies any leaking at this time. ?Patient stated that she was at her OB appointment this am and they lost power, so she was unable to have a [place].  ?+ FM reported. No ctx and or vaginal bleeding. ?

## 2021-06-20 ENCOUNTER — Other Ambulatory Visit (HOSPITAL_COMMUNITY): Payer: Self-pay

## 2021-06-20 ENCOUNTER — Inpatient Hospital Stay (HOSPITAL_COMMUNITY)
Admission: AD | Admit: 2021-06-20 | Discharge: 2021-06-23 | DRG: 788 | Disposition: A | Discharge status: Home / Self Care | Payer: 59 | Attending: Obstetrics & Gynecology | Admitting: Obstetrics & Gynecology

## 2021-06-20 ENCOUNTER — Other Ambulatory Visit: Payer: Self-pay

## 2021-06-20 ENCOUNTER — Inpatient Hospital Stay (HOSPITAL_COMMUNITY): Payer: 59 | Admitting: Anesthesiology

## 2021-06-20 ENCOUNTER — Encounter (HOSPITAL_COMMUNITY): Payer: Self-pay | Admitting: Obstetrics & Gynecology

## 2021-06-20 ENCOUNTER — Inpatient Hospital Stay (HOSPITAL_COMMUNITY): Payer: 59

## 2021-06-20 DIAGNOSIS — O9962 Diseases of the digestive system complicating childbirth: Secondary | ICD-10-CM | POA: Diagnosis present

## 2021-06-20 DIAGNOSIS — Z6791 Unspecified blood type, Rh negative: Secondary | ICD-10-CM

## 2021-06-20 DIAGNOSIS — Z3490 Encounter for supervision of normal pregnancy, unspecified, unspecified trimester: Secondary | ICD-10-CM | POA: Diagnosis present

## 2021-06-20 DIAGNOSIS — F419 Anxiety disorder, unspecified: Secondary | ICD-10-CM | POA: Diagnosis present

## 2021-06-20 DIAGNOSIS — O99892 Other specified diseases and conditions complicating childbirth: Principal | ICD-10-CM

## 2021-06-20 DIAGNOSIS — O99344 Other mental disorders complicating childbirth: Secondary | ICD-10-CM | POA: Diagnosis present

## 2021-06-20 DIAGNOSIS — O26893 Other specified pregnancy related conditions, third trimester: Secondary | ICD-10-CM | POA: Diagnosis present

## 2021-06-20 DIAGNOSIS — K219 Gastro-esophageal reflux disease without esophagitis: Secondary | ICD-10-CM | POA: Diagnosis present

## 2021-06-20 DIAGNOSIS — Z87891 Personal history of nicotine dependence: Secondary | ICD-10-CM | POA: Diagnosis not present

## 2021-06-20 DIAGNOSIS — Z3A39 39 weeks gestation of pregnancy: Secondary | ICD-10-CM | POA: Diagnosis not present

## 2021-06-20 DIAGNOSIS — O134 Gestational [pregnancy-induced] hypertension without significant proteinuria, complicating childbirth: Secondary | ICD-10-CM

## 2021-06-20 DIAGNOSIS — Z09 Encounter for follow-up examination after completed treatment for conditions other than malignant neoplasm: Secondary | ICD-10-CM

## 2021-06-20 DIAGNOSIS — O358XX0 Maternal care for other (suspected) fetal abnormality and damage, not applicable or unspecified: Secondary | ICD-10-CM | POA: Diagnosis present

## 2021-06-20 DIAGNOSIS — R0602 Shortness of breath: Secondary | ICD-10-CM | POA: Diagnosis present

## 2021-06-20 LAB — CBC
HCT: 38.3 % (ref 36.0–46.0)
HCT: 38.6 % (ref 36.0–46.0)
Hemoglobin: 13.2 g/dL (ref 12.0–15.0)
Hemoglobin: 13.5 g/dL (ref 12.0–15.0)
MCH: 30.9 pg (ref 26.0–34.0)
MCH: 31.5 pg (ref 26.0–34.0)
MCHC: 34.5 g/dL (ref 30.0–36.0)
MCHC: 35 g/dL (ref 30.0–36.0)
MCV: 89.7 fL (ref 80.0–100.0)
MCV: 90 fL (ref 80.0–100.0)
Platelets: 222 10*3/uL (ref 150–400)
Platelets: 202 10*3/uL (ref 150–400)
RBC: 4.27 MIL/uL (ref 3.87–5.11)
RBC: 4.29 MIL/uL (ref 3.87–5.11)
RDW: 12.9 % (ref 11.5–15.5)
RDW: 12.9 % (ref 11.5–15.5)
WBC: 9.4 10*3/uL (ref 4.0–10.5)
WBC: 12.2 10*3/uL — ABNORMAL HIGH (ref 4.0–10.5)
nRBC: 0 % (ref 0.0–0.2)
nRBC: 0 % (ref 0.0–0.2)

## 2021-06-20 LAB — TYPE AND SCREEN
ABO/RH(D): O NEG
Antibody Screen: NEGATIVE

## 2021-06-20 LAB — RPR: RPR Ser Ql: NONREACTIVE

## 2021-06-20 MED ORDER — PHENYLEPHRINE 40 MCG/ML (10ML) SYRINGE FOR IV PUSH (FOR BLOOD PRESSURE SUPPORT): 80.0000 ug | PREFILLED_SYRINGE | INTRAVENOUS | Status: DC | PRN

## 2021-06-20 MED ORDER — TERBUTALINE SULFATE 1 MG/ML IJ SOLN: 0.2500 mg | Freq: Once | INTRAMUSCULAR | Status: DC | PRN

## 2021-06-20 MED ORDER — OXYTOCIN-SODIUM CHLORIDE 30-0.9 UT/500ML-% IV SOLN
1.0000 m[IU]/min | INTRAVENOUS | Status: DC
  Administered 2021-06-20: 16:00:00 1 m[IU]/min via INTRAVENOUS
  Filled 2021-06-20: qty 500

## 2021-06-20 MED ORDER — OXYTOCIN-SODIUM CHLORIDE 30-0.9 UT/500ML-% IV SOLN: 2.5000 [IU]/h | INTRAVENOUS | Status: DC

## 2021-06-20 MED ORDER — LACTATED RINGERS IV SOLN: INTRAVENOUS | Status: DC

## 2021-06-20 MED ORDER — EPHEDRINE 5 MG/ML INJ: 10.0000 mg | INTRAVENOUS | Status: DC | PRN

## 2021-06-20 MED ORDER — LACTATED RINGERS IV SOLN
500.0000 mL | Freq: Once | INTRAVENOUS | Status: AC
  Administered 2021-06-20: 18:00:00 500 mL via INTRAVENOUS

## 2021-06-20 MED ORDER — OXYTOCIN BOLUS FROM INFUSION: 333.0000 mL | Freq: Once | INTRAVENOUS | Status: DC

## 2021-06-20 MED ORDER — OXYTOCIN-SODIUM CHLORIDE 30-0.9 UT/500ML-% IV SOLN: 1.0000 m[IU]/min | INTRAVENOUS | Status: DC

## 2021-06-20 MED ORDER — MISOPROSTOL 25 MCG QUARTER TABLET
25.0000 ug | ORAL_TABLET | ORAL | Status: AC | PRN
  Administered 2021-06-20 (×2): 25 ug via VAGINAL
  Filled 2021-06-20 (×2): qty 1

## 2021-06-20 MED ORDER — LACTATED RINGERS IV SOLN: 500.0000 mL | INTRAVENOUS | Status: DC | PRN

## 2021-06-20 MED ORDER — LIDOCAINE HCL (PF) 1 % IJ SOLN: 30.0000 mL | INTRAMUSCULAR | Status: DC | PRN

## 2021-06-20 MED ORDER — FENTANYL-BUPIVACAINE-NACL 0.5-0.125-0.9 MG/250ML-% EP SOLN
12.0000 mL/h | EPIDURAL | Status: DC | PRN
  Administered 2021-06-20: 18:00:00 12 mL/h via EPIDURAL
  Filled 2021-06-20: qty 250

## 2021-06-20 MED ORDER — SOD CITRATE-CITRIC ACID 500-334 MG/5ML PO SOLN
30.0000 mL | ORAL | Status: DC | PRN
  Administered 2021-06-20: 21:00:00 30 mL via ORAL
  Filled 2021-06-20: qty 30

## 2021-06-20 MED ORDER — ONDANSETRON HCL 4 MG/2ML IJ SOLN: 4.0000 mg | Freq: Four times a day (QID) | INTRAMUSCULAR | Status: DC | PRN

## 2021-06-20 MED ORDER — ACETAMINOPHEN 325 MG PO TABS: 650.0000 mg | ORAL_TABLET | ORAL | Status: DC | PRN

## 2021-06-20 MED ORDER — BUTORPHANOL TARTRATE 1 MG/ML IJ SOLN
1.0000 mg | INTRAMUSCULAR | Status: DC | PRN
Start: 2021-06-20 — End: 2021-06-21
  Administered 2021-06-20: 16:00:00 1 mg via INTRAVENOUS
  Filled 2021-06-20: qty 1

## 2021-06-20 MED ORDER — DIPHENHYDRAMINE HCL 50 MG/ML IJ SOLN: 12.5000 mg | INTRAMUSCULAR | Status: DC | PRN

## 2021-06-20 MED ORDER — LIDOCAINE HCL (PF) 1 % IJ SOLN
INTRAMUSCULAR | Status: DC | PRN
  Administered 2021-06-20 (×2): 4 mL via EPIDURAL

## 2021-06-20 NOTE — H&P (Addendum)
Tiffany Hester is a 26 y.o. female presenting for IOL at 39 wks due to increased heart rate, chronic shortness of breath- intolerance to pregnancy changes. Complete w/up withEKG<CT angio negative  ?G1. Dating by sono not c/w LMP ?Anxiety, Citalopram '10mg'$ ,  ?GERD- Pepcid, Pantoprazole  (for globus symptoms)  ?Rh neg - Rhogam in 1st trim and at 28 wks  ? ?Growth sono for S<D at 34 wks 5'1" 47% AC 47% Vx AFI 14 cm  ? ?Pt was doing very well until last 3 weeks of pregnancy when SOB/ palpitations and anxiety startedl. MAU w/up neg. BP nl. No PEC. She was started on Labetalol '50mg'$  1-2 times per day woth some relief and she stopped working this week that helped some ? ? ?OB History   ? ? Gravida  ?1  ? Para  ?   ? Term  ?   ? Preterm  ?   ? AB  ?   ? Living  ?   ?  ? ? SAB  ?   ? IAB  ?   ? Ectopic  ?   ? Multiple  ?   ? Live Births  ?   ?   ?  ?  ? ?Past Medical History:  ?Diagnosis Date  ? Anxiety   ? Asthma   ? Bilateral ovarian cysts   ? Chronic UTI (urinary tract infection)   ? Depression   ? Panic attack 12/11/2012  ? Pregnancy induced hypertension   ? Right ovarian cyst 07/26/2020  ? ?Past Surgical History:  ?Procedure Laterality Date  ? LAPAROSCOPY N/A 07/26/2020  ? Procedure: LAPAROSCOPY OPERATIVE;  Surgeon: Janyth Contes, MD;  Location: [place];  Service: Gynecology;  Laterality: N/A;  ? OVARIAN CYST REMOVAL Right 07/26/2020  ? Procedure: OVARIAN CYSTECTOMY;  Surgeon: Janyth Contes, MD;  Location: [place];  Service: Gynecology;  Laterality: Right;  ? TONSILLECTOMY    ? ?Family History: family history includes ADD / ADHD in her brother; Alcohol abuse in her paternal uncle; Crohn's disease in her father; Heart attack in her paternal grandfather and paternal uncle; Hypertension in her father. ?Social History:  reports that she quit smoking about 2 years ago. Her smoking use included cigarettes. She has never used smokeless tobacco. She reports that she does not currently use alcohol. She reports that she  does not use drugs. ? ? ?  ?Maternal Diabetes: No ?Genetic Screening: Normal ?Maternal Ultrasounds/Referrals: Fetal Kidney Anomalies- fetal hydroureter noted at most recent sono in MAU when she was being assessed for fluid leakage and MFM did sono for AFI. It was not seen in office  ?Fetal Ultrasounds or other Referrals:  None ?Maternal Substance Abuse:  No ?Significant Maternal Medications:  Meds include: Other: Citalopram '10mg'$ , Pepcid, Pantoprazole, Labetalol '50mg'$  1-2 per day since 36 wks (for tachycardia), Unisom prn, ?Significant Maternal Lab Results:  Group B Strep negative and Rh negative ?Other Comments:  Anxiety . FOB has pectus excavatum  ? ?Review of Systems ?History ?  ?Blood pressure 128/82, pulse 87, temperature 97.9 ?F (36.6 ?C), temperature source Oral, resp. rate 15, height '5\' 9"'$  (1.753 m), weight 80.6 kg, last menstrual period 06/27/2020. ?Exam ?Physical Exam  ?BP 128/82 (BP Location: Left Arm)   Pulse 87   Temp 97.9 ?F (36.6 ?C) (Oral)   Resp 15   Ht '5\' 9"'$  (1.753 m)   Wt 80.6 kg   LMP 06/27/2020   BMI 26.23 kg/m?  ? ?Physical exam:  ?A&O x 3, no acute distress. Pleasant ?  HEENT neg, no thyromegaly ?Lungs CTA bilat ?CV RRR, S1S2 normal ?Abdo soft, non tender, non acute ?Extr no edema/ tenderness ?Pelvic defer to RN ?FHT  120s + accels no decels mod variab- cat I  ?Toco none  ? ?Prenatal labs: ?ABO, Rh: --/--/PENDING (03/09 3953) ?Antibody: PENDING (03/09 2023) ?Rubella: Immune (08/29 0000) ?RPR: Nonreactive (08/29 0000)  ?HBsAg: Negative (08/29 0000)  ?HIV: Non-reactive (08/29 0000)  ?GBS: Negative/-- (02/16 0000)  ?Glucola nl  ? ? ?Assessment/Plan: ?26 yo G1 at 39 wks IOL for maternal SOB/ anxiety and palpitations.  ?Cytotec/ then pitoci + cervical balloon if needed.  ?FHT cat I ?Anxiety contin Citalopram  ?GERD cont Pantoprazole ?Fetal hydroureter only seen recently (2 days back) -- Peds to f/up  ? ?[place] ?06/20/2021, 7:18 AM ? ? ? ? ?

## 2021-06-20 NOTE — Progress Notes (Signed)
Tiffany Hester is a 26 y.o. G1P0 at 36w0dby ultrasound admitted for induction of labor due to maternal symptoms as in H&P ?S/p cytotec x 2 doses (last at 11.30 am), low dose pitocin since 4 pm  ? ?Subjective: ?Feels well w/ epidural   ? ?Objective: ?BP 116/67   Pulse 74   Temp (!) 97.4 ?F (36.3 ?C) (Oral)   Resp 18   Ht '5\' 9"'$  (1.753 m)   Wt 80.6 kg   LMP 06/27/2020   SpO2 99%   BMI 26.23 kg/m?  ? ?FHT: 120 bpm, variability: moderate,  accelerations:  Present,  decelerations:  Present repetitive mild variable decels noted  ?UC:   regular, every 2-3 minutes, pitocin at 2 mu now (1x1 rate)  ?SVE:   3/50%/ -1 AROM clear. Feel loop of cord behind now anterior to the head b/w head and anterior cervical wall but head is below it c/w occult cord presentation. Pt was placed in trendelenburg and reassess in 2 hrs as long as no significant change in FHT ? ? ?Assessment / Plan: ?Induction of labor due to maternal distress. Early labor, anticipate entering active labor soon, continue Low dose pitocin increase by 1x1 due to occult cord  ?Pt advised I cannot change cord position or try amnio-infusion as trying to do so can cause cord prolapse and need for stat c/section. Will continue to assess dilation and cord position. Trendenlenburg given ?Preeclampsia:   NA ?Fetal Wellbeing:  Category I ?Pain Control: epidural ?Anticipated MOD:  NSVD EFW 7-7.1/2 lbs / guarded since cord palpated anterior to cx  ? ?[place]?06/20/2021, 11:20 PM ? ? ?

## 2021-06-20 NOTE — Anesthesia Procedure Notes (Signed)
Epidural ?Patient location during procedure: OB ?Start time: 06/20/2021 6:15 PM ?End time: 06/20/2021 6:18 PM ? ?Staffing ?Anesthesiologist: Brennan Bailey, MD ?Performed: anesthesiologist  ? ?Preanesthetic Checklist ?Completed: patient identified, IV checked, risks and benefits discussed, monitors and equipment checked, pre-op evaluation and timeout performed ? ?Epidural ?Patient position: sitting ?Prep: DuraPrep and site prepped and draped ?Patient monitoring: continuous pulse ox, blood pressure and heart rate ?Approach: midline ?Location: L3-L4 ?Injection technique: LOR air ? ?Needle:  ?Needle type: Tuohy  ?Needle gauge: 17 G ?Needle length: 9 cm ?Needle insertion depth: 4 cm ?Catheter type: closed end flexible ?Catheter size: 19 Gauge ?Catheter at skin depth: 9 cm ?Test dose: negative and Other (1% lidocaine) ? ?Assessment ?Events: blood not aspirated, injection not painful, no injection resistance, no paresthesia and negative IV test ? ?Additional Notes ?Patient identified. Risks, benefits, and alternatives discussed with patient including but not limited to bleeding, infection, nerve damage, paralysis, failed block, incomplete pain control, headache, blood pressure changes, nausea, vomiting, reactions to medication, itching, and postpartum back pain. Confirmed with bedside nurse the patient's most recent platelet count. Confirmed with patient that they are not currently taking any anticoagulation, have any bleeding history, or any family history of bleeding disorders. Patient expressed understanding and wished to proceed. All questions were answered. Sterile technique was used throughout the entire procedure. Please see nursing notes for vital signs.  ? ?Crisp LOR on first pass. Test dose was given through epidural catheter and negative prior to continuing to dose epidural or start infusion. Warning signs of high block given to the patient including shortness of breath, tingling/numbness in hands, complete motor  block, or any concerning symptoms with instructions to call for help. Patient was given instructions on fall risk and not to get out of bed. All questions and concerns addressed with instructions to call with any issues or inadequate analgesia.  Reason for block:procedure for pain ? ? ? ?

## 2021-06-20 NOTE — Progress Notes (Addendum)
Tiffany Hester is a 26 y.o. G1P0 at 4w0dby ultrasound admitted for induction of labor due to maternal symptoms as in H&P ?S/p cytotec x 2 doses (last at 11.30 am), low dose pitocin since 4 pm  ? ?Subjective: ?Feels well w/ epidural   ? ?Objective: ?BP 129/79   Pulse 72   Temp (!) 97.4 ?F (36.3 ?C) (Oral)   Resp 18   Ht '5\' 9"'$  (1.753 m)   Wt 80.6 kg   LMP 06/27/2020   SpO2 99%   BMI 26.23 kg/m?  ? ?FHT:  FHR: 120 bpm, variability: moderate,  accelerations:  Present,  decelerations:  Present none ?UC:   regular, every 2-3 minutes, pitocin at 2 mu now (1x1 rate)  ?SVE:   3/50%/ -1 AROM clear. Feel loop of cord behind head\ ? ?Closely monitor for change in FHT  ? ?Labs: ?Lab Results  ?Component Value Date  ? WBC 12.2 (H) 06/20/2021  ? HGB 13.5 06/20/2021  ? HCT 38.6 06/20/2021  ? MCV 90.0 06/20/2021  ? PLT 202 06/20/2021  ? ? ?Assessment / Plan: ?Induction of labor due to maternal distress. Early labor, anticipate entering active labor soon ?Low dose pitocin  ?Labor:  IOL ?Preeclampsia:   NA ?Fetal Wellbeing:  Category I ?Pain Control: epidural ?Anticipated MOD:  NSVD EFW 7-7.1/2 lbs  ? ?[place]?06/20/2021, 7:38 PM ? ? ?

## 2021-06-20 NOTE — Anesthesia Preprocedure Evaluation (Addendum)
Anesthesia Evaluation  ?Patient identified by MRN, date of birth, ID band ?Patient awake ? ? ? ?Reviewed: ?Allergy & Precautions, NPO status , Patient's Chart, lab work & pertinent test results ? ?History of Anesthesia Complications ?Negative for: history of anesthetic complications ? ?Airway ?Mallampati: II ? ?TM Distance: >3 FB ?Neck ROM: Full ? ? ? Dental ?no notable dental hx. ? ?  ?Pulmonary ?asthma , former smoker,  ?  ?Pulmonary exam normal ? ? ? ? ? ? ? Cardiovascular ?hypertension, Normal cardiovascular exam ? ? ?  ?Neuro/Psych ?Anxiety Depression negative neurological ROS ?   ? GI/Hepatic ?negative GI ROS, Neg liver ROS,   ?Endo/Other  ?negative endocrine ROS ? Renal/GU ?negative Renal ROS  ?negative genitourinary ?  ?Musculoskeletal ?negative musculoskeletal ROS ?(+)  ? Abdominal ?  ?Peds ? Hematology ?negative hematology ROS ?(+)   ?Anesthesia Other Findings ?Day of surgery medications reviewed with patient. ? Reproductive/Obstetrics ?(+) Pregnancy (gHTN) ? ?  ? ? ? ? ? ? ? ? ? ? ? ? ? ?  ?  ? ? ? ? ? ? ? ?Anesthesia Physical ?Anesthesia Plan ? ?ASA: 2 and emergent ? ?Anesthesia Plan: Epidural  ? ?Post-op Pain Management:   ? ?Induction:  ? ?PONV Risk Score and Plan: 3 and Treatment may vary due to age or medical condition, Ondansetron and Dexamethasone ? ?Airway Management Planned: Natural Airway ? ?Additional Equipment: Fetal Monitoring ? ?Intra-op Plan:  ? ?Post-operative Plan:  ? ?Informed Consent: I have reviewed the patients History and Physical, chart, labs and discussed the procedure including the risks, benefits and alternatives for the proposed anesthesia with the patient or authorized representative who has indicated his/her understanding and acceptance.  ? ? ? ? ? ?Plan Discussed with: CRNA ? ?Anesthesia Plan Comments: (Epidural used for stat C/S (cord prolapse). Daiva Huge, MD)  ? ? ? ? ? ?Anesthesia Quick Evaluation ? ?

## 2021-06-20 NOTE — Progress Notes (Addendum)
Tiffany Hester is a 26 y.o. G1P0 at 26w0dby ultrasound admitted for induction of labor due to maternal symptoms as in H&P ?S/p cytotec x 2 doses (last at 11.30 am) ? ?Subjective: ?Notes some UCs  no vb/ LOF  ?Anxiety okay  ? ?Objective: ?BP 134/85   Pulse 75   Temp 98.5 ?F (36.9 ?C) (Oral)   Resp 17   Ht '5\' 9"'$  (1.753 m)   Wt 80.6 kg   LMP 06/27/2020   BMI 26.23 kg/m?  ? ?FHT:  FHR: 120 bpm, variability: moderate,  accelerations:  Present,  decelerations:  Present none ?UC:   regular, every 2-3 minutes since 2nd cytotec at 11.30 am  ?SVE:   Dilation: 1 ?Effacement (%): 30 ?Station: -1 ?Exam by:: Dr. MBenjie Karvonen?Cervical foley inserted, tolerated well. Cx is long/ tubular, but head down, well applied  to cx ? ?Labs: ?Lab Results  ?Component Value Date  ? WBC 9.4 06/20/2021  ? HGB 13.2 06/20/2021  ? HCT 38.3 06/20/2021  ? MCV 89.7 06/20/2021  ? PLT 222 06/20/2021  ? ? ?Assessment / Plan: ?Induction of labor due to maternal distress,  early labor. S/p cervical foley 60 cc. Start low dose pitocin at 1x1 from 3.30 pm if no change in UCs pattern and await foley expulsion then AROM  ? ?Labor:  IOL ?Preeclampsia:   NA ?Fetal Wellbeing:  Category I ?Pain Control:  Labor support without medications ?I/D:  n/a ?Anticipated MOD:  NSVD EFW 7-7.1/2 lbs  ? ?VDover?06/20/2021, 3:00 PM ? ? ?

## 2021-06-21 ENCOUNTER — Inpatient Hospital Stay (HOSPITAL_COMMUNITY): Payer: 59

## 2021-06-21 ENCOUNTER — Encounter (HOSPITAL_COMMUNITY)
Admission: AD | Disposition: A | Discharge status: Home / Self Care | Payer: Self-pay | Source: Home / Self Care | Attending: Obstetrics & Gynecology

## 2021-06-21 ENCOUNTER — Encounter (HOSPITAL_COMMUNITY): Payer: Self-pay | Admitting: Obstetrics & Gynecology

## 2021-06-21 ENCOUNTER — Other Ambulatory Visit: Payer: Self-pay

## 2021-06-21 DIAGNOSIS — O99892 Other specified diseases and conditions complicating childbirth: Secondary | ICD-10-CM | POA: Diagnosis present

## 2021-06-21 LAB — CBC
HCT: 38.7 % (ref 36.0–46.0)
Hemoglobin: 13.7 g/dL (ref 12.0–15.0)
MCH: 31.5 pg (ref 26.0–34.0)
MCHC: 35.4 g/dL (ref 30.0–36.0)
MCV: 89 fL (ref 80.0–100.0)
Platelets: 189 10*3/uL (ref 150–400)
RBC: 4.35 MIL/uL (ref 3.87–5.11)
RDW: 12.8 % (ref 11.5–15.5)
WBC: 20.6 10*3/uL — ABNORMAL HIGH (ref 4.0–10.5)
nRBC: 0 % (ref 0.0–0.2)

## 2021-06-21 SURGERY — Surgical Case
Anesthesia: Epidural

## 2021-06-21 MED ORDER — HYDROMORPHONE HCL 1 MG/ML IJ SOLN
INTRAMUSCULAR | Status: AC
  Filled 2021-06-21: qty 0.5

## 2021-06-21 MED ORDER — MORPHINE SULFATE (PF) 0.5 MG/ML IJ SOLN
INTRAMUSCULAR | Status: DC | PRN
Start: 2021-06-21 — End: 2021-06-21
  Administered 2021-06-21: 3 mg via EPIDURAL

## 2021-06-21 MED ORDER — OXYTOCIN-SODIUM CHLORIDE 30-0.9 UT/500ML-% IV SOLN
INTRAVENOUS | Status: DC | PRN
Start: 2021-06-21 — End: 2021-06-21
  Administered 2021-06-21: 30 [IU] via INTRAVENOUS

## 2021-06-21 MED ORDER — DIPHENHYDRAMINE HCL 50 MG/ML IJ SOLN: 12.5000 mg | INTRAMUSCULAR | Status: DC | PRN

## 2021-06-21 MED ORDER — KETOROLAC TROMETHAMINE 30 MG/ML IJ SOLN: 30.0000 mg | Freq: Four times a day (QID) | INTRAMUSCULAR | Status: AC | PRN

## 2021-06-21 MED ORDER — PROPOFOL 10 MG/ML IV BOLUS
INTRAVENOUS | Status: DC | PRN
  Administered 2021-06-21: 10 mg via INTRAVENOUS

## 2021-06-21 MED ORDER — NALOXONE HCL 0.4 MG/ML IJ SOLN: 0.4000 mg | INTRAMUSCULAR | Status: DC | PRN

## 2021-06-21 MED ORDER — FENTANYL CITRATE (PF) 100 MCG/2ML IJ SOLN
INTRAMUSCULAR | Status: AC
  Filled 2021-06-21: qty 2

## 2021-06-21 MED ORDER — DROPERIDOL 2.5 MG/ML IJ SOLN: 0.6250 mg | Freq: Once | INTRAMUSCULAR | Status: DC | PRN

## 2021-06-21 MED ORDER — SODIUM CHLORIDE 0.9 % IR SOLN
Status: DC | PRN
  Administered 2021-06-21: 1

## 2021-06-21 MED ORDER — ZOLPIDEM TARTRATE 5 MG PO TABS: 5.0000 mg | ORAL_TABLET | Freq: Every evening | ORAL | Status: DC | PRN

## 2021-06-21 MED ORDER — OXYCODONE HCL 5 MG PO TABS
5.0000 mg | ORAL_TABLET | Freq: Four times a day (QID) | ORAL | Status: DC | PRN
  Administered 2021-06-23 (×2): 5 mg via ORAL
  Filled 2021-06-21 (×2): qty 1

## 2021-06-21 MED ORDER — MENTHOL 3 MG MT LOZG: 1.0000 | LOZENGE | OROMUCOSAL | Status: DC | PRN

## 2021-06-21 MED ORDER — ACETAMINOPHEN 160 MG/5ML PO SOLN: 1000.0000 mg | Freq: Once | ORAL | Status: DC

## 2021-06-21 MED ORDER — NALOXONE HCL 4 MG/10ML IJ SOLN
1.0000 ug/kg/h | INTRAVENOUS | Status: DC | PRN
  Filled 2021-06-21: qty 5

## 2021-06-21 MED ORDER — WITCH HAZEL-GLYCERIN EX PADS: 1.0000 "application " | MEDICATED_PAD | CUTANEOUS | Status: DC | PRN

## 2021-06-21 MED ORDER — KETOROLAC TROMETHAMINE 30 MG/ML IJ SOLN
INTRAMUSCULAR | Status: AC
  Filled 2021-06-21: qty 1

## 2021-06-21 MED ORDER — DIPHENHYDRAMINE HCL 25 MG PO CAPS: 25.0000 mg | ORAL_CAPSULE | ORAL | Status: DC | PRN

## 2021-06-21 MED ORDER — ONDANSETRON HCL 4 MG/2ML IJ SOLN
INTRAMUSCULAR | Status: DC | PRN
  Administered 2021-06-21: 4 mg via INTRAVENOUS

## 2021-06-21 MED ORDER — FENTANYL CITRATE (PF) 100 MCG/2ML IJ SOLN
INTRAMUSCULAR | Status: AC
Start: 2021-06-21 — End: ?
  Filled 2021-06-21: qty 2

## 2021-06-21 MED ORDER — PANTOPRAZOLE SODIUM 20 MG PO TBEC
20.0000 mg | DELAYED_RELEASE_TABLET | Freq: Every day | ORAL | Status: DC
  Administered 2021-06-21: 20 mg via ORAL
  Filled 2021-06-21 (×2): qty 1

## 2021-06-21 MED ORDER — SIMETHICONE 80 MG PO CHEW
80.0000 mg | CHEWABLE_TABLET | Freq: Three times a day (TID) | ORAL | Status: DC
  Administered 2021-06-21 – 2021-06-23 (×6): 80 mg via ORAL
  Filled 2021-06-21 (×6): qty 1

## 2021-06-21 MED ORDER — OXYTOCIN-SODIUM CHLORIDE 30-0.9 UT/500ML-% IV SOLN
INTRAVENOUS | Status: AC
  Filled 2021-06-21: qty 500

## 2021-06-21 MED ORDER — TETANUS-DIPHTH-ACELL PERTUSSIS 5-2.5-18.5 LF-MCG/0.5 IM SUSY: 0.5000 mL | PREFILLED_SYRINGE | Freq: Once | INTRAMUSCULAR | Status: DC

## 2021-06-21 MED ORDER — PRENATAL MULTIVITAMIN CH
1.0000 | ORAL_TABLET | Freq: Every day | ORAL | Status: DC
  Administered 2021-06-22 – 2021-06-23 (×2): 1 via ORAL
  Filled 2021-06-21 (×2): qty 1

## 2021-06-21 MED ORDER — CEFAZOLIN SODIUM-DEXTROSE 2-4 GM/100ML-% IV SOLN
2.0000 g | Freq: Three times a day (TID) | INTRAVENOUS | Status: AC
  Administered 2021-06-21 – 2021-06-22 (×3): 2 g via INTRAVENOUS
  Filled 2021-06-21 (×4): qty 100

## 2021-06-21 MED ORDER — STERILE WATER FOR IRRIGATION IR SOLN
Status: DC | PRN
  Administered 2021-06-21: 1000 mL

## 2021-06-21 MED ORDER — CITALOPRAM HYDROBROMIDE 20 MG PO TABS
20.0000 mg | ORAL_TABLET | Freq: Every day | ORAL | Status: DC
  Administered 2021-06-21: 20 mg via ORAL
  Filled 2021-06-21 (×2): qty 1

## 2021-06-21 MED ORDER — KETOROLAC TROMETHAMINE 30 MG/ML IJ SOLN
30.0000 mg | Freq: Four times a day (QID) | INTRAMUSCULAR | Status: AC
  Administered 2021-06-21 (×3): 30 mg via INTRAVENOUS
  Filled 2021-06-21 (×3): qty 1

## 2021-06-21 MED ORDER — FENTANYL CITRATE (PF) 100 MCG/2ML IJ SOLN
INTRAMUSCULAR | Status: DC | PRN
  Administered 2021-06-21: 100 ug via EPIDURAL

## 2021-06-21 MED ORDER — PROPOFOL 10 MG/ML IV BOLUS
INTRAVENOUS | Status: AC
  Filled 2021-06-21: qty 20

## 2021-06-21 MED ORDER — KETOROLAC TROMETHAMINE 30 MG/ML IJ SOLN
30.0000 mg | Freq: Once | INTRAMUSCULAR | Status: AC
  Administered 2021-06-21: 30 mg via INTRAVENOUS

## 2021-06-21 MED ORDER — ALBUTEROL SULFATE (2.5 MG/3ML) 0.083% IN NEBU: 3.0000 mL | INHALATION_SOLUTION | RESPIRATORY_TRACT | Status: DC | PRN

## 2021-06-21 MED ORDER — ONDANSETRON HCL 4 MG/2ML IJ SOLN
INTRAMUSCULAR | Status: AC
  Filled 2021-06-21: qty 2

## 2021-06-21 MED ORDER — ACETAMINOPHEN 500 MG PO TABS
1000.0000 mg | ORAL_TABLET | Freq: Four times a day (QID) | ORAL | Status: AC
  Administered 2021-06-21 – 2021-06-22 (×4): 1000 mg via ORAL
  Filled 2021-06-21 (×4): qty 2

## 2021-06-21 MED ORDER — CEFAZOLIN SODIUM-DEXTROSE 2-4 GM/100ML-% IV SOLN
INTRAVENOUS | Status: AC
  Filled 2021-06-21: qty 100

## 2021-06-21 MED ORDER — MORPHINE SULFATE (PF) 0.5 MG/ML IJ SOLN
INTRAMUSCULAR | Status: AC
  Filled 2021-06-21: qty 10

## 2021-06-21 MED ORDER — CHLOROPROCAINE HCL (PF) 3 % IJ SOLN: INTRAMUSCULAR | Status: DC | PRN

## 2021-06-21 MED ORDER — IBUPROFEN 600 MG PO TABS
600.0000 mg | ORAL_TABLET | Freq: Four times a day (QID) | ORAL | Status: DC
  Administered 2021-06-22 – 2021-06-23 (×6): 600 mg via ORAL
  Filled 2021-06-21 (×6): qty 1

## 2021-06-21 MED ORDER — DIPHENHYDRAMINE HCL 25 MG PO CAPS: 25.0000 mg | ORAL_CAPSULE | Freq: Four times a day (QID) | ORAL | Status: DC | PRN

## 2021-06-21 MED ORDER — HYDROMORPHONE HCL 1 MG/ML IJ SOLN
0.5000 mg | INTRAMUSCULAR | Status: AC | PRN
  Administered 2021-06-21 (×2): 0.5 mg via INTRAVENOUS

## 2021-06-21 MED ORDER — SODIUM CHLORIDE 0.9 % IV SOLN
INTRAVENOUS | Status: AC
  Filled 2021-06-21: qty 5

## 2021-06-21 MED ORDER — OXYTOCIN-SODIUM CHLORIDE 30-0.9 UT/500ML-% IV SOLN: 2.5000 [IU]/h | INTRAVENOUS | Status: AC

## 2021-06-21 MED ORDER — COCONUT OIL OIL
1.0000 "application " | TOPICAL_OIL | Status: DC | PRN
  Administered 2021-06-22: 1 via TOPICAL

## 2021-06-21 MED ORDER — DEXAMETHASONE SODIUM PHOSPHATE 4 MG/ML IJ SOLN
INTRAMUSCULAR | Status: DC | PRN
  Administered 2021-06-21: 4 mg via INTRAVENOUS

## 2021-06-21 MED ORDER — DEXAMETHASONE SODIUM PHOSPHATE 4 MG/ML IJ SOLN
INTRAMUSCULAR | Status: AC
  Filled 2021-06-21: qty 1

## 2021-06-21 MED ORDER — SODIUM CHLORIDE 0.9 % IV SOLN
INTRAVENOUS | Status: DC | PRN
  Administered 2021-06-21: 500 mg via INTRAVENOUS

## 2021-06-21 MED ORDER — CEFAZOLIN SODIUM-DEXTROSE 2-3 GM-%(50ML) IV SOLR
INTRAVENOUS | Status: DC | PRN
  Administered 2021-06-21: 2 g via INTRAVENOUS

## 2021-06-21 MED ORDER — SODIUM CHLORIDE 0.9 % IV SOLN: INTRAVENOUS | Status: DC | PRN

## 2021-06-21 MED ORDER — SENNOSIDES-DOCUSATE SODIUM 8.6-50 MG PO TABS
2.0000 | ORAL_TABLET | Freq: Every day | ORAL | Status: DC
  Administered 2021-06-22 – 2021-06-23 (×2): 2 via ORAL
  Filled 2021-06-21 (×2): qty 2

## 2021-06-21 MED ORDER — LACTATED RINGERS IV SOLN: INTRAVENOUS | Status: DC | PRN

## 2021-06-21 MED ORDER — RHO D IMMUNE GLOBULIN 1500 UNIT/2ML IJ SOSY
300.0000 ug | PREFILLED_SYRINGE | Freq: Once | INTRAMUSCULAR | Status: AC
  Administered 2021-06-21: 300 ug via INTRAVENOUS
  Filled 2021-06-21: qty 2

## 2021-06-21 MED ORDER — ACETAMINOPHEN 325 MG PO TABS
650.0000 mg | ORAL_TABLET | ORAL | Status: DC | PRN
  Administered 2021-06-22 – 2021-06-23 (×5): 650 mg via ORAL
  Filled 2021-06-21 (×5): qty 2

## 2021-06-21 MED ORDER — FAMOTIDINE 20 MG PO TABS
10.0000 mg | ORAL_TABLET | Freq: Two times a day (BID) | ORAL | Status: DC
  Administered 2021-06-21 (×2): 10 mg via ORAL
  Filled 2021-06-21 (×4): qty 1

## 2021-06-21 MED ORDER — FENTANYL CITRATE (PF) 100 MCG/2ML IJ SOLN
25.0000 ug | INTRAMUSCULAR | Status: DC | PRN
  Administered 2021-06-21: 50 ug via INTRAVENOUS
  Administered 2021-06-21: 25 ug via INTRAVENOUS
  Administered 2021-06-21: 50 ug via INTRAVENOUS
  Administered 2021-06-21: 25 ug via INTRAVENOUS

## 2021-06-21 MED ORDER — SODIUM CHLORIDE 0.9% FLUSH: 3.0000 mL | INTRAVENOUS | Status: DC | PRN

## 2021-06-21 MED ORDER — CHLOROPROCAINE HCL (PF) 3 % IJ SOLN
INTRAMUSCULAR | Status: DC | PRN
  Administered 2021-06-21 (×2): 10 mL via EPIDURAL

## 2021-06-21 MED ORDER — DIBUCAINE (PERIANAL) 1 % EX OINT: 1.0000 "application " | TOPICAL_OINTMENT | CUTANEOUS | Status: DC | PRN

## 2021-06-21 MED ORDER — ONDANSETRON HCL 4 MG/2ML IJ SOLN: 4.0000 mg | Freq: Three times a day (TID) | INTRAMUSCULAR | Status: DC | PRN

## 2021-06-21 MED ORDER — SIMETHICONE 80 MG PO CHEW: 80.0000 mg | CHEWABLE_TABLET | ORAL | Status: DC | PRN

## 2021-06-21 MED ORDER — ACETAMINOPHEN 500 MG PO TABS: 1000.0000 mg | ORAL_TABLET | Freq: Once | ORAL | Status: DC

## 2021-06-21 SURGICAL SUPPLY — 36 items
BENZOIN TINCTURE PRP APPL 2/3 (GAUZE/BANDAGES/DRESSINGS) ×1 IMPLANT
CHLORAPREP W/TINT 26ML (MISCELLANEOUS) ×1 IMPLANT
CLAMP CORD UMBIL (MISCELLANEOUS) IMPLANT
CLOTH BEACON ORANGE TIMEOUT ST (SAFETY) ×2 IMPLANT
DRSG OPSITE POSTOP 4X10 (GAUZE/BANDAGES/DRESSINGS) ×2 IMPLANT
ELECT REM PT RETURN 9FT ADLT (ELECTROSURGICAL) ×2
ELECTRODE REM PT RTRN 9FT ADLT (ELECTROSURGICAL) ×1 IMPLANT
EXTRACTOR VACUUM KIWI (MISCELLANEOUS) ×1 IMPLANT
EXTRACTOR VACUUM M CUP 4 TUBE (SUCTIONS) ×1 IMPLANT
GLOVE SURG ENC MOIS LTX SZ7 (GLOVE) ×2 IMPLANT
GLOVE SURG UNDER POLY LF SZ7 (GLOVE) ×2 IMPLANT
GOWN STRL REUS W/TWL LRG LVL3 (GOWN DISPOSABLE) ×4 IMPLANT
KIT ABG SYR 3ML LUER SLIP (SYRINGE) IMPLANT
NDL HYPO 25X5/8 SAFETYGLIDE (NEEDLE) IMPLANT
NEEDLE HYPO 25X5/8 SAFETYGLIDE (NEEDLE) IMPLANT
NS IRRIG 1000ML POUR BTL (IV SOLUTION) ×2 IMPLANT
PACK C SECTION WH (CUSTOM PROCEDURE TRAY) ×2 IMPLANT
PAD OB MATERNITY 4.3X12.25 (PERSONAL CARE ITEMS) ×2 IMPLANT
RTRCTR C-SECT PINK 25CM LRG (MISCELLANEOUS) IMPLANT
STRIP CLOSURE SKIN 1/2X4 (GAUZE/BANDAGES/DRESSINGS) ×1 IMPLANT
SUT MNCRL 0 VIOLET CTX 36 (SUTURE) ×2 IMPLANT
SUT MONOCRYL 0 CTX 36 (SUTURE) ×2
SUT PLAIN 0 NONE (SUTURE) IMPLANT
SUT PLAIN 2 0 (SUTURE)
SUT PLAIN ABS 2-0 CT1 27XMFL (SUTURE) IMPLANT
SUT VIC AB 0 CT1 27 (SUTURE) ×2
SUT VIC AB 0 CT1 27XBRD ANBCTR (SUTURE) ×2 IMPLANT
SUT VIC AB 2-0 CT1 27 (SUTURE) ×1
SUT VIC AB 2-0 CT1 TAPERPNT 27 (SUTURE) ×1 IMPLANT
SUT VIC AB 2-0 SH 27 (SUTURE) ×2
SUT VIC AB 2-0 SH 27XBRD (SUTURE) ×2 IMPLANT
SUT VIC AB 4-0 KS 27 (SUTURE) ×2 IMPLANT
SUT VICRYL 0 TIES 12 18 (SUTURE) IMPLANT
TOWEL OR 17X24 6PK STRL BLUE (TOWEL DISPOSABLE) ×2 IMPLANT
TRAY FOLEY W/BAG SLVR 14FR LF (SET/KITS/TRAYS/PACK) IMPLANT
WATER STERILE IRR 1000ML POUR (IV SOLUTION) ×2 IMPLANT

## 2021-06-21 NOTE — Transfer of Care (Signed)
Immediate Anesthesia Transfer of Care Note ? ?Patient: Tiffany Hester ? ?Procedure(s) Performed: CESAREAN SECTION ? ?Patient Location: PACU ? ?Anesthesia Type:Epidural ? ?Level of Consciousness: awake, alert  and oriented ? ?Airway & Oxygen Therapy: Patient Spontanous Breathing ? ?Post-op Assessment: Report given to RN and Post -op Vital signs reviewed and stable ? ?Post vital signs: Reviewed and stable ? ?Last Vitals:  ?Vitals Value Taken Time  ?BP    ?Temp    ?Pulse    ?Resp    ?SpO2    ? ? ?Last Pain:  ?Vitals:  ? 06/20/21 2330  ?TempSrc: Oral  ?PainSc:   ?   ? ?  ? ?Complications: No notable events documented. ?

## 2021-06-21 NOTE — Plan of Care (Signed)
?  Problem: Education: ?Goal: Ability to make informed decisions regarding treatment and plan of care will improve ?Outcome: Completed/Met ?Goal: Ability to state and carry out methods to decrease the pain will improve ?Outcome: Completed/Met ?Goal: Individualized Educational Video(s) ?Outcome: Completed/Met ?  ?Problem: Coping: ?Goal: Ability to verbalize concerns and feelings about labor and delivery will improve ?Outcome: Completed/Met ?  ?Problem: Life Cycle: ?Goal: Ability to make normal progression through stages of labor will improve ?Outcome: Completed/Met ?Goal: Ability to effectively push during vaginal delivery will improve ?Outcome: Completed/Met ?  ?Problem: Role Relationship: ?Goal: Will demonstrate positive interactions with the child ?Outcome: Completed/Met ?  ?Problem: Safety: ?Goal: Risk of complications during labor and delivery will decrease ?Outcome: Completed/Met ?  ?Problem: Pain Management: ?Goal: Relief or control of pain from uterine contractions will improve ?Outcome: Completed/Met ?  ?

## 2021-06-21 NOTE — Anesthesia Postprocedure Evaluation (Signed)
Anesthesia Post Note ? ?Patient: Tiffany Hester ? ?Procedure(s) Performed: CESAREAN SECTION ? ?  ? ?Patient location during evaluation: PACU ?Anesthesia Type: Epidural ?Level of consciousness: awake and alert ?Pain management: pain level controlled ?Vital Signs Assessment: post-procedure vital signs reviewed and stable ?Respiratory status: spontaneous breathing, nonlabored ventilation and respiratory function stable ?Cardiovascular status: blood pressure returned to baseline ?Postop Assessment: epidural receding, no apparent nausea or vomiting, no headache and no backache ?Anesthetic complications: no ? ? ?No notable events documented. ? ?Last Vitals:  ?Vitals:  ? 06/21/21 0515 06/21/21 0530  ?BP: (!) 137/97 (!) 134/99  ?Pulse: 81 77  ?Resp: 19 17  ?Temp:    ?SpO2: 97% 93%  ?  ?  ?  ?  ?  ?  ?  ? ?Marthenia Rolling ? ? ? ? ?

## 2021-06-21 NOTE — Progress Notes (Addendum)
Late note ?RN called with cord prolapse. She was turning patient side to side and noted loop of cord at the labia. There was no fetal deceleration, so she called another RN who agreed with cord at the labia, she replaced it back in the vagina and called for MD.  ?I was getting ready to move another pt to OR, so instead we proceeded to OR for STAT c/s with this patient. RN informed that FHR was normal while she supported the cord in the vagina, while riding on the bed with her.  ?Pt gave verbal consent as we had discussed this earlier in the evening.  ?Cord prolapse, remote from delivery- proceeding w/ emerg C/s. Epidural was bolused on her way it and worked well for surgery.  ?Post-op Xray was done since we didn't get to count sutures. Xray was clear.  ? ? ?

## 2021-06-21 NOTE — Op Note (Addendum)
Cesarean Section Procedure Note  ? ?[name] ? ?06/21/2021 ? ?Indications:  Cord prolapse    ? ?Pre-operative Diagnosis: 39 wks, IOL, active labor, cord prolapse   ? ?Post-operative Diagnosis: Same  ? ?Surgeon: Azucena Fallen, MD  ? ?Assistants: none ? ?Anesthesia: epidural  ? ?Procedure Details:  ?The patient was seen in the Labor Room with cord prolapse, 6 cm dilation remote from delivery. So stat C-section was called and patient gave verbal consent. She was counseled on this before due to loop of cord palpated anterior to head earlier today and was aware that this situation may arise. A Time Out was not held due to emergency. Epidural anesthesia was bolused on our way to OR and was noted to be adequate. 2 gm ancef given. Abdomen was splashed with betadine and dabbed and patient was draped. Husband was able to attend. A pfannenstiel incision was made and carried down through the subcutaneous tissue to the fascia. Fascial incision was made and extended transversely. The fascia was separated by stretching and separated superiorly and inferiorly. The peritoneum was identified and entered. Peritoneal incision was extended by stretching.  Bladder blade placed and direct hysterotomy performed via utero-vesical peritoneal reflection and incision was stretched open. Head was not flexed and was not delivering wit fundal pressure. I extended skin incision but head was getting caught in abdominal muscle space, so I used Kiwi vacuum to deliver head . One single traction with controlled head delivery, rest was easy. Baby was limp so cord immediately clamped and cut and baby handed to NICU team. Apgar scores of 2 at one minute and 9 at five minutes. Cord ph was sent. Cord blood was obtained for evaluation. The placenta was removed Intact and appeared normal. The uterine outline, tubes and ovaries appeared normal. The uterine incision was closed with running locked sutures of 0Monocryl. A second imbricating layer sutured.   Hemostasis was observed. Alexis retractor removed. Peritoneal closure done with 2-0 Vicryl.  The fascia was then reapproximated with running sutures of 0Vicryl. The skin was closed with 4-0Vicryl. Steristrips, honeycomb placed.  ? ?Instrument, sponge, and needle counts were correct prior the abdominal closure and were correct at the conclusion of the case but Xray was needed since sutures were not counted before starting case and Xray was clear.  ? ?Findings: cord gas arterial 7.11 . Vacuum assisted delivery at C/section due to tight space. One controlled pull for delivery. Baby was shocky at birth so delayed cord clamping was deferred.  ?  ?Estimated Blood Loss: 160 cc per recorder ? ?Total IV Fluids: 1400 ml LR  ? ?Urine Output:  450 CC OF clear urine included from labor room ? ?Specimens: Cord blood and cord gas  ? ?Complications: no complications ? ?Disposition: PACU - hemodynamically stable.  ? ?Maternal Condition: stable  ? ?Baby condition / location:  Couplet care / Skin to Skin ? ?Attending Attestation: I performed the procedure.  ? ?Signed: ?Surgeon(s): ?Azucena Fallen, MD ? ? ? ?   ?

## 2021-06-21 NOTE — Lactation Note (Signed)
This note was copied from a baby's chart. ?Lactation Consultation Note ? ?Patient Name: Tiffany Hester ?Today's Date: 06/21/2021 ?Reason for consult: Follow-up assessment;1st time breastfeeding;Primapara;Term;Other (Comment) (baby latched as LC entered room on the left breast / cradle / with depth and swallows. still feeding 19 mins per mom comfortable) ?Age:28 hours ?LC noted increased swallows - right breast.  ?Left breast - semi inverted nipple, and LC recommended prior to every latch - breast massage, hand express, pre - pump with the #21 F and reverse pressure.  ?Before every feeding until baby can latch without problems.  ?LC also provided shells between feedings while awake .  ? ? ? ?Maternal Data ?Has patient been taught Hand Expression?: Yes ? ?Feeding ?Mother's Current Feeding Choice: Breast Milk ? ?LATCH Score ?Latch:  (latched with depth) ? ?Audible Swallowing:  (swallows noted) ? ?  ? ?Comfort (Breast/Nipple):  (per mom comfortable) ? ?Hold (Positioning):  (mom latched baby) ? ?  ? ? ?Lactation Tools Discussed/Used ?Tools: Pump;Shells;Flanges ?Flange Size: 24 ?Breast pump type: Manual ?Pump Education: Setup, frequency, and cleaning;Milk Storage ?Reason for Pumping: prior to latching ? ?Interventions ?Interventions: Breast feeding basics reviewed;Education ? ?Discharge ?Pump: Personal;DEBP ? ?Consult Status ?Consult Status: Follow-up ?Date: 06/22/21 ?Follow-up type: In-patient ? ? ? ?Jerlyn Ly Abbygail [place] ?06/21/2021, 2:45 PM ? ? ? ?

## 2021-06-21 NOTE — Lactation Note (Signed)
This note was copied from a baby's chart. ?Lactation Consultation Note ? ?Patient Name: Tiffany Hester ?Today's Date: 06/21/2021 ?Reason for consult: Initial assessment;Primapara;1st time breastfeeding;Term;Other (Comment) (per mom the baby last fed at 92. LC reviewed 24 hour feeding goals - to feed with feeding cues and by 3 hours if the baby is not showing feeding cues STS. mom aware to call with feeding cues for feeding assessment.) ?Age:28 hours ? ?Maternal Data ?Does the patient have breastfeeding experience prior to this delivery?: No ? ?Feeding ?Mother's Current Feeding Choice: Breast Milk ? ?LATCH Score ?  ? ?  ? ?  ? ?  ? ?  ? ?  ? ? ?Lactation Tools Discussed/Used ?  ? ?Interventions ?Interventions: Breast feeding basics reviewed;Education ? ?Discharge ?  ? ?Consult Status ?Consult Status: Follow-up ?Date: 06/21/21 ?Follow-up type: In-patient ? ? ? ?Jerlyn Ly Chalee Hirota ?06/21/2021, 10:27 AM ? ? ? ?

## 2021-06-21 NOTE — Progress Notes (Signed)
Post Partum Day 0. Emerg 1st LTCS for cord prolapse -Girl, 06/21/21 3.22 am Breast feeding  ?Subjective: ?Feels well. Foley in, plan to remove later once moving more. Tolerating some diet. No n/v. Pain is controlled. Anxiety stable, no panic attack  ? ?Objective: ?Blood pressure 125/81, pulse 66, temperature 97.6 ?F (36.4 ?C), temperature source Oral, resp. rate 18, height '5\' 9"'$  (1.753 m), weight 80.6 kg, last menstrual period 06/27/2020, SpO2 99 %, unknown if currently breastfeeding. ? ?Physical Exam:  ?General: alert and cooperative ?Lochia: appropriate ?Lungs CTA bil ?CV RRR ?Abdo soft, NABS  ?Uterine Fundus: firm ?Incision: healing well, no significant drainage (new dressing is dry, using abdo binder to pressure dressing since has adhesive allergy)  ?DVT Evaluation: No evidence of DVT seen on physical exam. ? ? ?CBC Latest Ref Rng & Units 06/21/2021 06/20/2021 06/20/2021  ?WBC 4.0 - 10.5 K/uL 20.6(H) 12.2(H) 9.4  ?Hemoglobin 12.0 - 15.0 g/dL 13.7 13.5 13.2  ?Hematocrit 36.0 - 46.0 % 38.7 38.6 38.3  ?Platelets 150 - 400 K/uL 189 202 222  ?  ?O+ ?Rub imm ? ?Assessment/Plan: ?POD #0 1' emerg LTCS for cord prolapse. Girl stable/ w mom. Breast feeding  ?Post op stable , routine care, foley out, ambulate etc  ?PP care routine  ?Anxiety- Citalopram at 10 mg dose (does not want higher)  ? ? ? LOS: 1 day  ? ?[place] ?06/21/2021, 12:39 PM  ? ? ?

## 2021-06-21 NOTE — Social Work (Signed)
MOB was referred for history of depression/anxiety and Panic attacks. ? ?* Referral screened out by Clinical Social Worker because none of the following criteria appear to apply: ?~ History of anxiety/depression during this pregnancy, or of post-partum depression following prior delivery. ?~ Diagnosis of anxiety and/or depression within last 3 years ?OR ?* MOB's symptoms currently being treated with medication and/or therapy. Per chart review, MOB takes Citalopram '10mg'$  to treat symptoms.  ? ?Please contact the Clinical Social Worker if needs arise, by [facility] request, or if MOB scores greater than 9/yes to question 10 on [place] Postpartum Depression Screen.  ? ?Tiffany Hester, MSW, LCSW ?[facility] [place]  ?Clinical Social Worker  ?[phone] ?06/21/2021  12:25 PM  ?

## 2021-06-22 LAB — RH IG WORKUP (INCLUDES ABO/RH)
Fetal Screen: NEGATIVE
Gestational Age(Wks): 39.1
Unit division: 0

## 2021-06-22 MED ORDER — CITALOPRAM HYDROBROMIDE 10 MG PO TABS
10.0000 mg | ORAL_TABLET | Freq: Every day | ORAL | Status: DC
Start: 2021-06-22 — End: 2021-06-23
  Administered 2021-06-22 – 2021-06-23 (×2): 10 mg via ORAL
  Filled 2021-06-22 (×2): qty 1

## 2021-06-22 NOTE — Lactation Note (Signed)
This note was copied from a baby's chart. ?Lactation Consultation Note ?Mom holding baby STS stating having hard time latching d/t hurts. ?LC assisted in football hold for deeper more controled latch. It hurt at first then got better as baby suckled. ?Baby had good flange deep latch. Instructed flanging lips, cheeks to breast, good props and support during BF, using coconut oil and colostrum to nipples, wear shells during the day. ?Mom has good everted Rt. Nipple, Lt, nipple inverted but everts well w/stimulation. ?Heard some swallows, saw baby swallowing during feeding. ?Praised mom. Mom stated it felt better. ?Discussed body alignment during feedings. ?Mom feels better. ?Mom will call if needs assistance. ? ?Patient Name: Tiffany Hester ?Today's Date: 06/22/2021 ?Reason for consult: Follow-up assessment;Mother's request;Primapara;Nipple pain/trauma;Term ?Age:28 hours ? ?Maternal Data ?Has patient been taught Hand Expression?: Yes ?Does the patient have breastfeeding experience prior to this delivery?: No ? ?Feeding ?  ? ?LATCH Score ?Latch: Grasps breast easily, tongue down, lips flanged, rhythmical sucking. ? ?Audible Swallowing: A few with stimulation ? ?Type of Nipple: Everted at rest and after stimulation (Rt. everted Lt. inverted, but everts w/stimulation) ? ?Comfort (Breast/Nipple): Filling, red/small blisters or bruises, mild/mod discomfort (bruising to nipples) ? ?Hold (Positioning): Assistance needed to correctly position infant at breast and maintain latch. ? ?LATCH Score: 7 ? ? ?Lactation Tools Discussed/Used ?Tools: Shells;Coconut oil ? ?Interventions ?Interventions: Breast feeding basics reviewed;Assisted with latch;Skin to skin;Breast massage;Hand express;Support pillows;Adjust position;Position options ? ?Discharge ?  ? ?Consult Status ?Consult Status: Follow-up ?Date: 06/23/21 ?Follow-up type: In-patient ? ? ? ?Theodoro Kalata ?06/22/2021, 10:56 PM ? ? ? ?

## 2021-06-22 NOTE — Progress Notes (Signed)
Subjective: ?Postpartum Day One: Cesarean Delivery ? ?Patient doing well this morning. Pain well managed with pain medications. Ambulating slowly without dizziness. VB slowing down. Spontaneously voiding and passing gas. Tolerating a regular diet, denies any N/V. Denies CP, SOB, HA.  ? ?Baby girl doing well at bedside, working on BF ? ?Objective: ?Patient Vitals for the past 24 hrs: ? BP Temp Temp src Pulse Resp SpO2  ?06/22/21 0500 115/67 98.4 ?F (36.9 ?C) Oral (!) 59 16 --  ?06/22/21 0005 97/74 98.3 ?F (36.8 ?C) Oral 79 18 --  ?06/21/21 1932 120/78 98.5 ?F (36.9 ?C) Oral 69 16 --  ?06/21/21 1514 -- 98.1 ?F (36.7 ?C) Oral -- -- 99 %  ? ? ?Intake/Output Summary (Last 24 hours) at 06/22/2021 1259 ?Last data filed at 06/21/2021 2000 ?Gross per 24 hour  ?Intake --  ?Output 1550 ml  ?Net -1550 ml  ? ? ? ?Physical Exam:  ?General: alert and no distress ?Lochia: appropriate ?Uterine Fundus: firm ?Incision: no significant drainage ?DVT Evaluation: No evidence of DVT seen on physical exam. ?No significant calf/ankle edema. ? ?Recent Labs  ?  06/20/21 ?1719 06/21/21 ?0618  ?HGB 13.5 13.7  ?HCT 38.6 38.7  ? ? ?Assessment/Plan: ?Status post Cesarean section. Doing well postoperatively.  ?Continue current care. ?Tiffany Hester G1P1001 POD#1 sp emergent primary c/s at  57w1dcor umbilical cord prolapse ?1. PPC: cont current pain regimen, encourage ambulation, and regular diet ?2. Postop CBC HGB stable at 13.7 ?3. RH NEG, Baby blood type O POS, f/u ppx Rhogam admin ?4. Rubella Imm ?5. Lactation consult PRN ?6. Anxiety well managed on Citalopram 10 ?7. Anticipate DC home tomorrow POD2 ? ? ?Tiffany Hester ?06/22/2021, 12:58 PM  ? ?

## 2021-06-23 MED ORDER — SENNOSIDES-DOCUSATE SODIUM 8.6-50 MG PO TABS: 2.0000 | ORAL_TABLET | Freq: Every day | ORAL | Status: DC

## 2021-06-23 MED ORDER — IBUPROFEN 600 MG PO TABS: 600.0000 mg | ORAL_TABLET | Freq: Four times a day (QID) | ORAL | 0 refills | Status: DC

## 2021-06-23 MED ORDER — ACETAMINOPHEN 325 MG PO TABS: 650.0000 mg | ORAL_TABLET | ORAL | Status: DC | PRN

## 2021-06-23 MED ORDER — OXYCODONE HCL 5 MG PO TABS: 5.0000 mg | ORAL_TABLET | Freq: Four times a day (QID) | ORAL | 0 refills | Status: DC | PRN

## 2021-06-23 NOTE — Progress Notes (Signed)
Subjective: ?Postpartum Day Two: Cesarean Delivery ? ?Patient doing well, is having more incisional pain today but pain medication does help. Ambulating without dizziness. Minimal VB. Tolerating regular diet. No N/V. Spontaneously voiding and passing gas. No CP, SOB, or HA. 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 Requesting DC home today if baby cleared ? ?Baby girl doing well at bedside, working on BF ? ?Objective: ?Patient Vitals for the past 24 hrs: ? BP Temp Temp src Pulse Resp SpO2  ?06/23/21 0647 122/76 97.6 ?F (36.4 ?C) Oral 60 18 99 %  ?06/22/21 2203 128/74 98.1 ?F (36.7 ?C) Oral 98 18 --  ?06/22/21 1642 123/78 98 ?F (36.7 ?C) Oral 67 17 99 %  ? ? ?Physical Exam:  ?General: alert and no distress ?Lochia: appropriate ?Uterine Fundus: firm ?Incision: no significant drainage ?DVT Evaluation: No evidence of DVT seen on physical exam. ? ?Recent Labs  ?  06/20/21 ?1719 06/21/21 ?0618  ?HGB 13.5 13.7  ?HCT 38.6 38.7  ? ? ?Assessment/Plan: ?Status post Cesarean section. Doing well postoperatively.  ?Continue current care. ?Sedalia Greeson G1P1001 POD#2 sp primary c/s at 42w1dfor cord prolapse, doing well meeting postop milestones ?1. PPC: cont current pain regimen, enc ambulation, regular diet ?2. RH NEG, baby RH POS. S/p PP Rhogam ?3. Rubella IMM ?4. Discharge instructions and call parameters reviewed in anticipation of discharge, however at this time baby has not been cleared by Peds. OBellefor discharge if baby discharge by Peds, otherwise anticipate DC home tomorrow ? ?Brenna Friesenhahn A Charissa Knowles ?06/23/2021, 1:17 PM  ? ?

## 2021-06-23 NOTE — Discharge Summary (Signed)
? ?  Postpartum Discharge Summary ? ?Date of Service updated 06/23/21 ? ?   ?Patient Name: Tiffany Hester ?DOB: Jan 19, 1996 ?MRN: [id number] ? ?Date of admission: 06/20/2021 ?Delivery date:06/21/2021  ?Delivering provider: MODY, VAISHALI  ?Date of discharge: 06/23/2021 ? ?Admitting diagnosis: Encounter for induction of labor [Z34.90] ?Delivery by emergency cesarean [O99.892] ?Intrauterine pregnancy: [redacted]w[redacted]d    ?Secondary diagnosis:  Principal Problem: ?  Encounter for induction of labor ?Active Problems: ?  Delivery by emergency cesarean ? ?Additional problems: N/A    ?Discharge diagnosis: Term Pregnancy Delivered                                              ?Post partum procedures:rhogam ?Augmentation: AROM and Pitocin ?Complications: None ? ?Hospital course: Onset of Labor With Unplanned C/S   ?26y.o. yo G1P1001 at 39w1das admitted in Latent Labor on 06/20/2021. Patient had a labor course significant for umbilical cord prolapse. The patient went for cesarean section due to Cord Prolapse. Delivery details as follows: ?Membrane Rupture Time/Date: 6:31 PM ,06/20/2021   ?Delivery Method:C-Section, Low Transverse  ?Details of operation can be found in separate operative note. Patient had an uncomplicated postpartum course.  She is ambulating,tolerating a regular diet, passing flatus, and urinating well.  Patient is discharged home in stable condition 06/23/21. ? ?Newborn Data: ?Birth date:06/21/2021  ?Birth time:3:22 AM  ?Gender:Female  ?Living status:Living  ?Apgars:2 ,9  ?Weight:3120 g  ? ?Magnesium Sulfate received: No ?BMZ received: No ?Rhophylac:Yes ?MMR:N/A ?T-DaP:Given prenatally ? ?Physical exam  ?Vitals:  ? 06/22/21 0500 06/22/21 1642 06/22/21 2203 06/23/21 0647  ?BP: 115/67 123/78 128/74 122/76  ?Pulse: (!) 59 67 98 60  ?Resp: _0 ?Temp: 98.4 ?F (36.9 ?C) 98 ?F (36.7 ?C) 98.1 ?F (36.7 ?C) 97.6 ?F (36.4 ?C)  ?TempSrc: Oral Oral Oral Oral  ?SpO2:  99%  99%  ?Weight:      ?Height:      ? ?General: alert and no  distress ?Lochia: appropriate ?Uterine Fundus: firm ?Incision: Dressing is clean, dry, and intact ?DVT Evaluation: No evidence of DVT seen on physical exam. ?Labs: ?Lab Results  ?Component Value Date  ? WBC 20.6 (H) 06/21/2021  ? HGB 13.7 06/21/2021  ? HCT 38.7 06/21/2021  ? MCV 89.0 06/21/2021  ? PLT 189 06/21/2021  ? ?CMP Latest Ref Rng & Units 06/03/2021  ?Glucose 70 - 99 mg/dL 114(H)  ?BUN 6 - 20 mg/dL 6  ?Creatinine 0.44 - 1.00 mg/dL 0.55  ?Sodium 135 - 145 mmol/L 136  ?Potassium 3.5 - 5.1 mmol/L 3.5  ?Chloride 98 - 111 mmol/L 105  ?CO2 22 - 32 mmol/L 20(L)  ?Calcium 8.9 - 10.3 mg/dL 8.5(L)  ?Total Protein 6.5 - 8.1 g/dL 5.8(L)  ?Total Bilirubin 0.3 - 1.2 mg/dL 0.4  ?Alkaline Phos 38 - 126 U/L 188(H)  ?AST 15 - 41 U/L 24  ?ALT 0 - 44 U/L 10  ? ?Edinburgh Score: ?Edinburgh Postnatal Depression Scale Screening Tool 06/22/2021  ?I have been able to laugh and see the funny side of things. 1  ?I have looked forward with enjoyment to things. 0  ?I have blamed myself unnecessarily when things went wrong. 2  ?I have been anxious or worried for no good reason. 2  ?I have felt scared or panicky for no good reason. 2  ?Things have been getting on top of  me. 2  ?I have been so unhappy that I have had difficulty sleeping. 0  ?I have felt sad or miserable. 1  ?I have been so unhappy that I have been crying. 1  ?The thought of harming myself has occurred to me. 0  ?Edinburgh Postnatal Depression Scale Total 11  ? ? ? ? ?After visit meds:  ?Allergies as of 06/23/2021   ? ?   Reactions  ? Tape   ? ?  ? ?  ?Medication List  ?  ? ?STOP taking these medications   ? ?clindamycin 1 % gel ?Commonly known as: CLINDAGEL ?  ?ProAir HFA 108 (90 Base) MCG/ACT inhaler ?Generic drug: albuterol ?  ? ?  ? ?TAKE these medications   ? ?acetaminophen 325 MG tablet ?Commonly known as: TYLENOL ?Take 2 tablets (650 mg total) by mouth every 4 (four) hours as needed for mild pain (temperature > 101.5.). ?  ?citalopram 10 MG tablet ?Commonly known as:  CELEXA ?TAKE 1 TABLET(10 MG) BY MOUTH DAILY ?  ?doxylamine (Sleep) 25 MG tablet ?Commonly known as: UNISOM ?Take 25 mg by mouth at bedtime as needed. ?  ?famotidine 10 MG tablet ?Commonly known as: PEPCID ?Take 10 mg by mouth 2 (two) times daily. ?  ?ibuprofen 600 MG tablet ?Commonly known as: ADVIL ?Take 1 tablet (600 mg total) by mouth every 6 (six) hours. ?  ?multivitamin-prenatal 27-0.8 MG Tabs tablet ?Take 1 tablet by mouth daily at 12 noon. ?  ?oxyCODONE 5 MG immediate release tablet ?Commonly known as: Oxy IR/ROXICODONE ?Take 1 tablet (5 mg total) by mouth every 6 (six) hours as needed for moderate pain. ?  ?pantoprazole 20 MG tablet ?Commonly known as: PROTONIX ?Take 20 mg by mouth daily. ?  ?senna-docusate 8.6-50 MG tablet ?Commonly known as: Senokot-S ?Take 2 tablets by mouth daily. ?Start taking on: June 24, 2021 ?  ? ?  ? ?  ?  ? ? ?  ?Discharge Care Instructions  ?(From admission, onward)  ?  ? ? ?  ? ?  Start     Ordered  ? 06/23/21 0000  Discharge wound care:       ?Comments: Remove Honeycomb dressing at 5-7 days post cesarean section date. Keep incision clean and dry.  ? 06/23/21 1502  ? ?  ?  ? ?  ? ? ? ?Discharge home in stable condition ?Infant Feeding: Breast ?Infant Disposition:home with mother ?Discharge instruction: per After Visit Summary and Postpartum booklet. ?Activity: Advance as tolerated. Pelvic rest for 6 weeks.  ?Diet: routine diet ?Anticipated Birth Control: Unsure ?Postpartum Appointment:6 weeks ?Additional Postpartum F/U: Postpartum Depression checkup ?Future Appointments: ?Future Appointments  ?Date Time Provider [place]  ?07/17/2021 10:30 AM Merian Capron, MD [facility] None  ? ?Follow up Visit: ?Patient to follow up in office with primary provider Dr. Benjie Karvonen in 2 weeks for PPD screen and 6 week routine postpartum visit. ? ?  ? ?06/23/2021 ?Ottilia Pippenger A Demari Gales, DO ? ? ?

## 2021-06-23 NOTE — Lactation Note (Signed)
This note was copied from a baby's chart. ?Lactation Consultation Note ? ?Patient Name: Tiffany Hester ?Today's Date: 06/23/2021 ?Reason for consult: Follow-up assessment;Mother's request ?Age:28 hours ? ?Maternal Data ? ? Visited with Mom per her request prior to discharge to address questions. Dad was present and supportive. Mom asked about engorgement and mastitis prevention. Educated Mom on how to treat engorgement and mastitis. Mom had questions about when to introduce bottles into feeding plan. Dad asked questions about how to adequately support Mom during lactation journey, spoke to them about how to share feeding responsibilities. Provided information to parents about breastfeeding support and resources. Answered all questions and concerns they had at the time during session.  ? ?Feeding ?Mother's Current Feeding Choice: Breast Milk ? ?Interventions ?Interventions: Breast feeding basics reviewed;Education;Pace feeding ? ?Discharge ?Discharge Education: Engorgement and breast care ?Pump: Personal ? ?Consult Status ?Consult Status: Complete ?Date: 06/23/21 ?Follow-up type: In-patient ? ? ?Hessie Dibble ?06/23/2021, 4:41 PM ? ? ? ?

## 2021-06-23 NOTE — Progress Notes (Signed)
CSW received consult due to score 11 on Edinburgh Depression Screen. CSW met with MOB at bedside to complete assessment, FOB present. MOB granted CSW verbal permission to speak in front of FOB about anything. CSW introduced self and explained consult. MOB was welcoming, open, thorough, pleasant, and remained engaged during assessment. MOB was very attentive to infant providing care to infant when CSW first came into the room. CSW and MOB discussed MOB's mental health history. MOB reported that she was diagnosed with anxiety and depression in 2014. MOB shared that she has not experienced depression in a Sheddrick Lattanzio time and is currently only experiencing anxiety. MOB reported that she is currently taking 43m Celexa which is helpful and managed [facility] Psychiat[facility] [place] MOB shared that she has taken Seroquel in the past but has been off that medication for 4 1/2 years. MOB reported that she weaned from 46mCelexa to 10 mg Celexa for the past 2 years and feels really good on that dosage. MOB reported that she has an upcoming appointment with her Psychiatrist in mid April to check in to see how things are going as they typically meet every 3 months. CSW positively affirmed MOB having an upcoming appointment to check in. MOB denied any additional mental health history. CSW and MOB discussed edinburgh score 11. MOB attributed her score to anticipation surrounding giving birth and taking off work a little earlier than she wanted to. MOB described her delivery as traumatic explaining that she had an emergency c-section. MOB also shared concerns about infant's health and just wanting her to be okay. CSW acknowledged, validated, and normalized MOB's feelings. MOB reported that the medical team is keeping them updated on infant's health. CSW inquired about how MOB was feeling emotionally since giving birth, MOB reported that she has been feeling really good. MOB presented calm and  possessed great insight about her mental health. MOB did not demonstrate any acute mental health signs/symptoms. CSW assessed for safety, MOB denied SI and HI. CSW did not assess for domestic violence as FOB was present. CSW inquired about MOB's support system, MOB reported that her husband, parents, and husband's parents were supports. MOB reported that they have a good support system. CSW informed MOB that she may be more susceptible to postpartum depression due to her mental health history.  ? ?CSW provided education regarding Baby Blues vs PMADs and provided MOB with resources for mental health follow up.  CSW encouraged MOB to evaluate her mental health throughout the postpartum period with the use of the New Mom Checklist developed by Postpartum Progress as well as the Ed[place]oth[place]natal Depression Scale and notify a medical professional if symptoms arise.    ? ?CSW provided review of Sudden Infant Death Syndrome (SIDS) precautions. MOB verbalized understanding and reported having all items needed to care for infant including a car seat, basinet, and crib.  ? ?CSW identifies no further need for intervention and no barriers to discharge at this time. ? ?KiAbundio MiuLCSW ?Clinical Social Worker ?[facility] ?Cell#: [phone] ? ?

## 2021-07-02 ENCOUNTER — Telehealth (HOSPITAL_COMMUNITY): Payer: Self-pay | Admitting: *Deleted

## 2021-07-02 NOTE — Telephone Encounter (Signed)
Hospital Discharge Follow-Up Call: ? ?Patient reports that she is doing OK and that they are "settling in".  She says that she has no concerns about her physical healing process.  Her anxiety has worsened and she says that her OB increased dosage on her Celexa which she takes for anxiety.  She has an incision and mood/medication check on 3/32/23.  She also reports that she has an appointment with her psychiatrist in early April.  EPDS today was 14.  Will notify OB. ? ?Patient says that baby is well and she has no concerns about her baby's health.  She reports that baby sleeps in a bassinet beside her bed at night and in a PackNPlay in the living room during the day.  Reviewed ABCs of Safe Sleep. ?

## 2021-07-17 ENCOUNTER — Telehealth (INDEPENDENT_AMBULATORY_CARE_PROVIDER_SITE_OTHER): Payer: 59 | Admitting: Psychiatry

## 2021-07-17 ENCOUNTER — Encounter (HOSPITAL_COMMUNITY): Payer: Self-pay | Admitting: Psychiatry

## 2021-07-17 DIAGNOSIS — F5102 Adjustment insomnia: Secondary | ICD-10-CM | POA: Diagnosis not present

## 2021-07-17 DIAGNOSIS — F411 Generalized anxiety disorder: Secondary | ICD-10-CM | POA: Diagnosis not present

## 2021-07-17 DIAGNOSIS — F410 Panic disorder [episodic paroxysmal anxiety] without agoraphobia: Secondary | ICD-10-CM

## 2021-07-17 NOTE — Progress Notes (Signed)
[place] Follow up visit ? ?Patient Identification: Tiffany Hester ?MRN:  [id number] ?Date of Evaluation:  07/17/2021 ?Referral Source: primary care. Dr. Rosann Auerbach ?Chief Complaint: follow up  anxiety ?Visit Diagnosis:  ?  ICD-10-CM   ?1. GAD (generalized anxiety disorder)  F41.1   ?  ?2. Adjustment insomnia  F51.02   ?  ?3. Panic attack  F41.0   ?  ? ?Virtual Visit via Video Note ? ?I connected with [name] on 07/17/21 at 10:30 AM EDT by a video enabled telemedicine application and verified that I am speaking with the correct person using two identifiers. ? ?Location: ?Patient: home ?Provider: home office ?  ?I discussed the limitations of evaluation and management by telemedicine and the availability of in person appointments. The patient expressed understanding and agreed to proceed. ? ? ?  ?I discussed the assessment and treatment plan with the patient. The patient was provided an opportunity to ask questions and all were answered. The patient agreed with the plan and demonstrated an understanding of the instructions. ?  ?The patient was advised to call back or seek an in-person evaluation if the symptoms worsen or if the condition fails to improve as anticipated. ? ?I provided 15 minutes of non-face-to-face time during this encounter. ? ? ? ?History of Present Illness: 26 years old currently married Caucasian female initially eferred by primary care physician to establish care for anxiety she works as a Art therapist ? ?She is post partum 3 weeks, had to get C section  ?Felt anxious first week after, PCP increased her celexa '20mg'$ , doing better ? Has good support ?Anxiety and panic improved ? Was crying first week improved now with depression, some tiredness  ? ?Bonding good,  ?Supportive family ? ?Aggravating factors;job can be, recent delivery by C section, financial stress ?Modifying factors; her husband, daughter, family ?Severity manageable ? ?Past Psychiatric History: anxiety, depression ? ?Previous  Psychotropic Medications: Yes  ? ?Substance Abuse History in the last 12 months:  No. ? ?Consequences of Substance Abuse: ?NA ? ?Past Medical History:  ?Past Medical History:  ?Diagnosis Date  ? Anxiety   ? Asthma   ? Bilateral ovarian cysts   ? Chronic UTI (urinary tract infection)   ? Depression   ? Panic attack 12/11/2012  ? Pregnancy induced hypertension   ? Right ovarian cyst 07/26/2020  ?  ?Past Surgical History:  ?Procedure Laterality Date  ? CESAREAN SECTION N/A 06/21/2021  ? Procedure: CESAREAN SECTION;  Surgeon: Azucena Fallen, MD;  Location: [facility];  Service: Obstetrics;  Laterality: N/A;  ? LAPAROSCOPY N/A 07/26/2020  ? Procedure: LAPAROSCOPY OPERATIVE;  Surgeon: Janyth Contes, MD;  Location: [facility];  Service: Gynecology;  Laterality: N/A;  ? OVARIAN CYST REMOVAL Right 07/26/2020  ? Procedure: OVARIAN CYSTECTOMY;  Surgeon: Janyth Contes, MD;  Location: [facility];  Service: Gynecology;  Laterality: Right;  ? TONSILLECTOMY    ? ? ? ?Family History:  ?Family History  ?Problem Relation Age of Onset  ? Hypertension Father   ? Crohn's disease Father   ? ADD / ADHD Brother   ? Heart attack Paternal Uncle   ? Alcohol abuse Paternal Uncle   ? Heart attack Paternal Grandfather   ? ? ?Social History:   ?Social History  ? ?Socioeconomic History  ? Marital status: Married  ?  Spouse name: Not on file  ? Number of children: Not on file  ? Years of education: Not on file  ? Highest education level: Not on file  ?  Occupational History  ? Not on file  ?Tobacco Use  ? Smoking status: Former  ?  Types: Cigarettes  ?  Quit date: 06/07/2019  ?  Years since quitting: 2.1  ? Smokeless tobacco: Never  ?Vaping Use  ? Vaping Use: Former  ? Substances: Nicotine  ?Substance and Sexual Activity  ? Alcohol use: Not Currently  ?  Comment: ocassionally  ? Drug use: Never  ?  Types: Marijuana  ?  Comment: no longer using  ? Sexual activity: Yes  ?  Partners: Male  ?Other Topics Concern  ? Not on file  ?Social History Narrative   ? ** Merged History Encounter **  ?    ? ?Social Determinants of Health  ? ?Financial Resource Strain: Not on file  ?Food Insecurity: Not on file  ?Transportation Needs: Not on file  ?Physical Activity: Not on file  ?Stress: Not on file  ?Social Connections: Not on file  ? ? ? ?Allergies:   ?Allergies  ?Allergen Reactions  ? Tape   ? ? ?Metabolic Disorder Labs: ?No results found for: HGBA1C, MPG ?No results found for: PROLACTIN ?No results found for: CHOL, TRIG, HDL, CHOLHDL, VLDL, LDLCALC ?No results found for: TSH ? ?Therapeutic Level Labs: ?No results found for: LITHIUM ?No results found for: CBMZ ?No results found for: VALPROATE ? ?Current Medications: ?Current Outpatient Medications  ?Medication Sig Dispense Refill  ? acetaminophen (TYLENOL) 325 MG tablet Take 2 tablets (650 mg total) by mouth every 4 (four) hours as needed for mild pain (temperature > 101.5.).    ? citalopram (CELEXA) 20 MG tablet Take 20 mg by mouth daily.    ? doxylamine, Sleep, (UNISOM) 25 MG tablet Take 25 mg by mouth at bedtime as needed.    ? famotidine (PEPCID) 10 MG tablet Take 10 mg by mouth 2 (two) times daily.    ? ibuprofen (ADVIL) 600 MG tablet Take 1 tablet (600 mg total) by mouth every 6 (six) hours. 30 tablet 0  ? oxyCODONE (OXY IR/ROXICODONE) 5 MG immediate release tablet Take 1 tablet (5 mg total) by mouth every 6 (six) hours as needed for moderate pain. 30 tablet 0  ? pantoprazole (PROTONIX) 20 MG tablet Take 20 mg by mouth daily.    ? Prenatal Vit-Fe Fumarate-FA (MULTIVITAMIN-PRENATAL) 27-0.8 MG TABS tablet Take 1 tablet by mouth daily at 12 noon.    ? senna-docusate (SENOKOT-S) 8.6-50 MG tablet Take 2 tablets by mouth daily.    ? ?No current facility-administered medications for this visit.  ? ? ? ? ?Psychiatric Specialty Exam: ?Review of Systems  ?Cardiovascular:  Negative for chest pain.  ?Psychiatric/Behavioral:  Negative for agitation and behavioral problems.    ?unknown if currently breastfeeding.There is no height  or weight on file to calculate BMI.  ?General Appearance: Casual  ?Eye Contact:  Fair  ?Speech:  Clear and Coherent  ?Volume:  Decreased  ?Mood:  Euthymic  ?Affect:  Constricted  ?Thought Process:  Goal Directed  ?Orientation:  Full (Time, Place, and Person)  ?Thought Content:  Logical  ?Suicidal Thoughts:  No  ?Homicidal Thoughts:  No  ?Memory:  Immediate;   Fair ?Recent;   Fair  ?Judgement:  Fair  ?Insight:  Good  ?Psychomotor Activity:  Normal  ?Concentration:  Concentration: Fair and Attention Span: Fair  ?Recall:  Good  ?[facility] [place]  ?Language: Good  ?Akathisia:  No  ?Handed:   ?AIMS (if indicated):  not done  ?Assets:  Communication Skills ?Desire for Improvement ?Financial Resources/Insurance ?  Physical Health  ?ADL's:  Intact  ?Cognition: WNL  ?Sleep:   variable to poor  ? ?Screenings: ?PHQ2-9   ? ?Flowsheet Row Video Visit from 09/19/2020 in [facility]  ?PHQ-2 Total Score 0  ? ?  ? ?Flowsheet Row Video Visit from 07/17/2021 in [facility] Admission (Discharged) from 06/20/2021 in [facility] Mother Baby Unit Admission (Discharged) from 06/18/2021 in [facility] Assessment Unit  ?C-SSRS RISK CATEGORY No Risk No Risk No Risk  ? ?  ? ? ?Assessment and Plan: as follows ?Prior documentation reviewed ? ? ?Panic attacks: fair on celexa now '20mg'$  ? ? ?Anxiety disorder; manageable on celexa continue ? ?Insomnia; irregular, she will take naps when baby sleeps ? ?Fu 6 weeks ?Call for refills ?Merian Capron, MD ?4/5/202310:41 AM ?

## 2021-07-22 ENCOUNTER — Other Ambulatory Visit (HOSPITAL_COMMUNITY): Payer: Self-pay

## 2021-07-22 MED ORDER — CITALOPRAM HYDROBROMIDE 20 MG PO TABS: 20.0000 mg | ORAL_TABLET | Freq: Every day | ORAL | 0 refills | Status: DC

## 2021-09-11 ENCOUNTER — Encounter (HOSPITAL_COMMUNITY): Payer: Self-pay | Admitting: Psychiatry

## 2021-09-11 ENCOUNTER — Telehealth (INDEPENDENT_AMBULATORY_CARE_PROVIDER_SITE_OTHER): Payer: 59 | Admitting: Psychiatry

## 2021-09-11 DIAGNOSIS — F410 Panic disorder [episodic paroxysmal anxiety] without agoraphobia: Secondary | ICD-10-CM | POA: Diagnosis not present

## 2021-09-11 DIAGNOSIS — F5102 Adjustment insomnia: Secondary | ICD-10-CM | POA: Diagnosis not present

## 2021-09-11 DIAGNOSIS — F411 Generalized anxiety disorder: Secondary | ICD-10-CM

## 2021-09-11 MED ORDER — CITALOPRAM HYDROBROMIDE 20 MG PO TABS: 20.0000 mg | ORAL_TABLET | Freq: Every day | ORAL | 0 refills | Status: DC

## 2021-09-11 NOTE — Progress Notes (Signed)
[place] Follow up visit  Patient Identification: Tiffany Hester MRN:  [id number] Date of Evaluation:  09/11/2021 Referral Source: primary care. Dr. Rosann Auerbach Chief Complaint: follow up  anxiety Visit Diagnosis:    ICD-10-CM   1. GAD (generalized anxiety disorder)  F41.1     2. 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 Panic attack  F41.0      Virtual Visit via Video Note  I connected with Tiffany Hester on 09/11/21 at  9:30 AM EDT by a video enabled telemedicine application and verified that I am speaking with the correct person using two identifiers.  Location: Patient: home Provider: home office   I discussed the limitations of evaluation and management by telemedicine and the availability of in person appointments. The patient expressed understanding and agreed to proceed.     I discussed the assessment and treatment plan with the patient. The patient was provided an opportunity to ask questions and all were answered. The patient agreed with the plan and demonstrated an understanding of the instructions.   The patient was advised to call back or seek an in-person evaluation if the symptoms worsen or if the condition fails to improve as anticipated.  I provided 15 minutes of non-face-to-face time during this encounter.    History of Present Illness: 26 years old currently married Caucasian female initially eferred by primary care physician to establish care for anxiety she works as a Art therapist  She is post partum 2 months, had to get C section   Has been doing fair but gets anxious, has family support feels staying at home . Trying to plan outside but feels anxious of joining back works as well Not endorses depression Bonding good,  Supportive family  Aggravating factors;job can be, recent delivery by C section, financial stress Modifying factors; husband, family Severity anxious  Past Psychiatric History: anxiety, depression  Previous Psychotropic Medications: Yes    Substance Abuse History in the last 12 months:  No.  Consequences of Substance Abuse: NA  Past Medical History:  Past Medical History:  Diagnosis Date   Anxiety    Asthma    Bilateral ovarian cysts    Chronic UTI (urinary tract infection)    Depression    Panic attack 12/11/2012   Pregnancy induced hypertension    Right ovarian cyst 07/26/2020    Past Surgical History:  Procedure Laterality Date   CESAREAN SECTION N/A 06/21/2021   Procedure: CESAREAN SECTION;  Surgeon: Azucena Fallen, MD;  Location: [facility];  Service: Obstetrics;  Laterality: N/A;   LAPAROSCOPY N/A 07/26/2020   Procedure: LAPAROSCOPY OPERATIVE;  Surgeon: Janyth Contes, MD;  Location: [facility];  Service: Gynecology;  Laterality: N/A;   OVARIAN CYST REMOVAL Right 07/26/2020   Procedure: OVARIAN CYSTECTOMY;  Surgeon: Janyth Contes, MD;  Location: [facility];  Service: Gynecology;  Laterality: Right;   TONSILLECTOMY       Family History:  Family History  Problem Relation Age of Onset   Hypertension Father    Crohn's disease Father    ADD / ADHD Brother    Heart attack Paternal Uncle    Alcohol abuse Paternal Uncle    Heart attack Paternal Grandfather     Social History:   Social History   Socioeconomic History   Marital status: Married    Spouse name: Not on file   Number of children: Not on file   Years of education: Not on file   Highest education level: Not on file  Occupational History  Not on file  Tobacco Use   Smoking status: Former    Types: Cigarettes    Quit date: 06/07/2019    Years since quitting: 2.2   Smokeless tobacco: Never  Vaping Use   Vaping Use: Former   Substances: Nicotine  Substance and Sexual Activity   Alcohol use: Not Currently    Comment: ocassionally   Drug use: Never    Types: Marijuana    Comment: no longer using   Sexual activity: Yes    Partners: Male  Other Topics Concern   Not on file  Social History Narrative   ** Merged History Encounter  **       Social Determinants of Health   Financial Resource Strain: Not on file  Food Insecurity: Not on file  Transportation Needs: Not on file  Physical Activity: Not on file  Stress: Not on file  Social Connections: Not on file     Allergies:   Allergies  Allergen Reactions   Tape     Metabolic Disorder Labs: No results found for: HGBA1C, MPG No results found for: PROLACTIN No results found for: CHOL, TRIG, HDL, CHOLHDL, VLDL, LDLCALC No results found for: TSH  Therapeutic Level Labs: No results found for: LITHIUM No results found for: CBMZ No results found for: VALPROATE  Current Medications: Current Outpatient Medications  Medication Sig Dispense Refill   acetaminophen (TYLENOL) 325 MG tablet Take 2 tablets (650 mg total) by mouth every 4 (four) hours as needed for mild pain (temperature > 101.5.).     citalopram (CELEXA) 20 MG tablet Take 1 tablet (20 mg total) by mouth daily. 90 tablet 0   doxylamine, Sleep, (UNISOM) 25 MG tablet Take 25 mg by mouth at bedtime as needed.     famotidine (PEPCID) 10 MG tablet Take 10 mg by mouth 2 (two) times daily.     ibuprofen (ADVIL) 600 MG tablet Take 1 tablet (600 mg total) by mouth every 6 (six) hours. 30 tablet 0   oxyCODONE (OXY IR/ROXICODONE) 5 MG immediate release tablet Take 1 tablet (5 mg total) by mouth every 6 (six) hours as needed for moderate pain. 30 tablet 0   pantoprazole (PROTONIX) 20 MG tablet Take 20 mg by mouth daily.     Prenatal Vit-Fe Fumarate-FA (MULTIVITAMIN-PRENATAL) 27-0.8 MG TABS tablet Take 1 tablet by mouth daily at 12 noon.     senna-docusate (SENOKOT-S) 8.6-50 MG tablet Take 2 tablets by mouth daily.     No current facility-administered medications for this visit.      Psychiatric Specialty Exam: Review of Systems  Cardiovascular:  Negative for chest pain.  Psychiatric/Behavioral:  Negative for agitation and behavioral problems. The patient is nervous/anxious.    unknown if currently  breastfeeding.There is no height or weight on file to calculate BMI.  General Appearance: Casual  Eye Contact:  Fair  Speech:  Clear and Coherent  Volume:  Decreased  Mood: anxious  Affect:  Constricted  Thought Process:  Goal Directed  Orientation:  Full (Time, Place, and Person)  Thought Content:  Logical  Suicidal Thoughts:  No  Homicidal Thoughts:  No  Memory:  Immediate;   Fair Recent;   Fair  Judgement:  Fair  Insight:  Good  Psychomotor Activity:  Normal  Concentration:  Concentration: Fair and Attention Span: Fair  Recall:  Good  Fund of Knowledge:Good  Language: Good  Akathisia:  No  Handed:   AIMS (if indicated):  not done  Assets:  Communication Skills Desire for  Improvement Financial Resources/Insurance Physical Health  ADL's:  Intact  Cognition: WNL  Sleep:   variable to poor   Screenings: PHQ2-9    Flowsheet Row Video Visit from 09/19/2020 in [facility]  PHQ-2 Total Score 0      Flowsheet Row Video Visit from 09/11/2021 in [facility] Video Visit from 07/17/2021 in [facility] Admission (Discharged) from 06/20/2021 in [facility] 4S Mother Baby Unit  C-SSRS RISK CATEGORY No Risk No Risk No Risk       Assessment and Plan: as follows Prior documentation reviewed   Panic attacks: denies, continue celexa   Anxiety disorder; getting anxious does not want to increase celexa, plans to add activities and work with job people if they can lower hours when she joins  Insomnia;not worse, continue to work on sleep hygiene Refills sent Call for concerns as of now does not want to increase med  Merian Capron, MD 5/31/20239:35 AM

## 2021-11-06 ENCOUNTER — Telehealth (INDEPENDENT_AMBULATORY_CARE_PROVIDER_SITE_OTHER): Payer: 59 | Admitting: Psychiatry

## 2021-11-06 ENCOUNTER — Encounter (HOSPITAL_COMMUNITY): Payer: Self-pay | Admitting: Psychiatry

## 2021-11-06 DIAGNOSIS — F5102 Adjustment insomnia: Secondary | ICD-10-CM | POA: Diagnosis not present

## 2021-11-06 DIAGNOSIS — F410 Panic disorder [episodic paroxysmal anxiety] without agoraphobia: Secondary | ICD-10-CM

## 2021-11-06 DIAGNOSIS — F411 Generalized anxiety disorder: Secondary | ICD-10-CM

## 2021-11-06 NOTE — Progress Notes (Signed)
[facility] Follow up visit  Patient Identification: Tiffany Hester MRN:  [id number] Date of Evaluation:  11/06/2021 Referral Source: primary care. Dr. Rosann Auerbach Chief Complaint: follow up  anxiety Visit Diagnosis:    ICD-10-CM   1. GAD (generalized anxiety disorder)  F41.1     2. 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 Panic attack  F41.0      Virtual Visit via Video Note  I connected with Tiffany Hester on 11/06/21 at  1:30 PM EDT by a video enabled telemedicine application and verified that I am speaking with the correct person using two identifiers.  Location: Patient: home Provider: home office   I discussed the limitations of evaluation and management by telemedicine and the availability of in person appointments. The patient expressed understanding and agreed to proceed.     I discussed the assessment and treatment plan with the patient. The patient was provided an opportunity to ask questions and all were answered. The patient agreed with the plan and demonstrated an understanding of the instructions.   The patient was advised to call back or seek an in-person evaluation if the symptoms worsen or if the condition fails to improve as anticipated.  I provided 15 minutes of non-face-to-face time during this encounter.    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  Consequences of Substance Abuse: NA  Past Medical History:  Past Medical History:  Diagnosis Date    Anxiety    Asthma    Bilateral ovarian cysts    Chronic UTI (urinary tract infection)    Depression    Panic attack 12/11/2012   Pregnancy induced hypertension    Right ovarian cyst 07/26/2020    Past Surgical History:  Procedure Laterality Date   CESAREAN SECTION N/A 06/21/2021   Procedure: CESAREAN SECTION;  Surgeon: Azucena Fallen, MD;  Location: [facility];  Service: Obstetrics;  Laterality: N/A;   LAPAROSCOPY N/A 07/26/2020   Procedure: LAPAROSCOPY OPERATIVE;  Surgeon: Janyth Contes, MD;  Location: [facility];  Service: Gynecology;  Laterality: N/A;   OVARIAN CYST REMOVAL Right 07/26/2020   Procedure: OVARIAN CYSTECTOMY;  Surgeon: Janyth Contes, MD;  Location: [facility];  Service: Gynecology;  Laterality: Right;   TONSILLECTOMY       Family History:  Family History  Problem Relation Age of Onset   Hypertension Father    Crohn's disease Father    ADD / ADHD Brother    Heart attack Paternal Uncle    Alcohol abuse Paternal Uncle    Heart attack Paternal Grandfather     Social History:   Social History   Socioeconomic History   Marital status: Married    Spouse name: Not on file   Number of children: Not on file   Years of education: Not on file   Highest education level: Not on file  Occupational History   Not on file  Tobacco Use   Smoking status: Former    Types: Cigarettes    Quit date: 06/07/2019  Years since quitting: 2.4   Smokeless tobacco: Never  Vaping Use   Vaping Use: Former   Substances: Nicotine  Substance and Sexual Activity   Alcohol use: Not Currently    Comment: ocassionally   Drug use: Never    Types: Marijuana    Comment: no longer using   Sexual activity: Yes    Partners: Male  Other Topics Concern   Not on file  Social History Narrative   ** Merged History Encounter **       Social Determinants of Health   Financial Resource Strain: Not on file  Food Insecurity: Not on file  Transportation Needs: Not on file  Physical  Activity: Not on file  Stress: Not on file  Social Connections: Not on file     Allergies:   Allergies  Allergen Reactions   Tape     Metabolic Disorder Labs: No results found for: "HGBA1C", "MPG" No results found for: "PROLACTIN" No results found for: "CHOL", "TRIG", "HDL", "CHOLHDL", "VLDL", "LDLCALC" No results found for: "TSH"  Therapeutic Level Labs: No results found for: "LITHIUM" No results found for: "CBMZ" No results found for: "VALPROATE"  Current Medications: Current Outpatient Medications  Medication Sig Dispense Refill   acetaminophen (TYLENOL) 325 MG tablet Take 2 tablets (650 mg total) by mouth every 4 (four) hours as needed for mild pain (temperature > 101.5.).     citalopram (CELEXA) 20 MG tablet Take 1 tablet (20 mg total) by mouth daily. 90 tablet 0   doxylamine, Sleep, (UNISOM) 25 MG tablet Take 25 mg by mouth at bedtime as needed.     famotidine (PEPCID) 10 MG tablet Take 10 mg by mouth 2 (two) times daily.     ibuprofen (ADVIL) 600 MG tablet Take 1 tablet (600 mg total) by mouth every 6 (six) hours. 30 tablet 0   oxyCODONE (OXY IR/ROXICODONE) 5 MG immediate release tablet Take 1 tablet (5 mg total) by mouth every 6 (six) hours as needed for moderate pain. 30 tablet 0   pantoprazole (PROTONIX) 20 MG tablet Take 20 mg by mouth daily.     Prenatal Vit-Fe Fumarate-FA (MULTIVITAMIN-PRENATAL) 27-0.8 MG TABS tablet Take 1 tablet by mouth daily at 12 noon.     senna-docusate (SENOKOT-S) 8.6-50 MG tablet Take 2 tablets by mouth daily.     No current facility-administered medications for this visit.      Psychiatric Specialty Exam: Review of Systems  Cardiovascular:  Negative for chest pain.  Psychiatric/Behavioral:  Negative for agitation and behavioral problems.     unknown if currently breastfeeding.There is no height or weight on file to calculate BMI.  General Appearance: Casual  Eye Contact:  Fair  Speech:  Clear and Coherent  Volume:  Decreased   Mood: fair  Affect:  Constricted  Thought Process:  Goal Directed  Orientation:  Full (Time, Place, and Person)  Thought Content:  Logical  Suicidal Thoughts:  No  Homicidal Thoughts:  No  Memory:  Immediate;   Fair Recent;   Fair  Judgement:  Fair  Insight:  Good  Psychomotor Activity:  Normal  Concentration:  Concentration: Fair and Attention Span: Fair  Recall:  Good  Fund of Knowledge:Good  Language: Good  Akathisia:  No  Handed:   AIMS (if indicated):  not done  Assets:  Communication Skills Desire for Improvement Financial Resources/Insurance Physical Health  ADL's:  Intact  Cognition: WNL  Sleep:   variable to poor   Screenings: [employer]  Video Visit from 09/19/2020 in [facility]  PHQ-2 Total Score 0      Flowsheet Row Video Visit from 11/06/2021 in [facility] Video Visit from 09/11/2021 in [facility] Video Visit from 07/17/2021 in [facility] No Risk No Risk No Risk       Assessment and Plan: as follows  Prior documentation reviewed   Panic attacks: denies continue celexa    Anxiety disorder; manageable, continue celexa, will consider lowering to '10mg'$  next visit Insomnia;not worse, continue to work on sleep hygiene Refills sent if due    Merian Capron, MD 7/26/20231:47 PM

## 2021-12-27 ENCOUNTER — Encounter (HOSPITAL_COMMUNITY): Payer: Self-pay | Admitting: Psychiatry

## 2021-12-27 ENCOUNTER — Telehealth (INDEPENDENT_AMBULATORY_CARE_PROVIDER_SITE_OTHER): Payer: 59 | Admitting: Psychiatry

## 2021-12-27 DIAGNOSIS — F5102 Adjustment insomnia: Secondary | ICD-10-CM | POA: Diagnosis not present

## 2021-12-27 DIAGNOSIS — F411 Generalized anxiety disorder: Secondary | ICD-10-CM

## 2021-12-27 DIAGNOSIS — F410 Panic disorder [episodic paroxysmal anxiety] without agoraphobia: Secondary | ICD-10-CM

## 2021-12-27 MED ORDER — CITALOPRAM HYDROBROMIDE 20 MG PO TABS: 20.0000 mg | ORAL_TABLET | Freq: Every day | ORAL | 0 refills | Status: DC

## 2021-12-27 NOTE — Progress Notes (Signed)
[facility] Follow up visit  Patient Identification: Tiffany Hester MRN:  [id number] Date of Evaluation:  12/27/2021 Referral Source: primary care. Dr. Rosann Auerbach Chief Complaint: follow up  anxiety Visit Diagnosis:    ICD-10-CM   1. GAD (generalized anxiety disorder)  F41.1     2. 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 Panic attack  F41.0      Virtual Visit via Video Note  I connected with [name] on 12/27/21 at  9:00 AM EDT by a video enabled telemedicine application and verified that I am speaking with the correct person using two identifiers.  Location: Patient: home Provider: home office   I discussed the limitations of evaluation and management by telemedicine and the availability of in person appointments. The patient expressed understanding and agreed to proceed.     I discussed the assessment and treatment plan with the patient. The patient was provided an opportunity to ask questions and all were answered. The patient agreed with the plan and demonstrated an understanding of the instructions.   The patient was advised to call back or seek an in-person evaluation if the symptoms worsen or if the condition fails to improve as anticipated.  I provided 15 minutes of non-face-to-face time during this encounter.    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  Consequences of Substance Abuse: NA  Past  Medical History:  Past Medical History:  Diagnosis Date   Anxiety    Asthma    Bilateral ovarian cysts    Chronic UTI (urinary tract infection)    Depression    Panic attack 12/11/2012   Pregnancy induced hypertension    Right ovarian cyst 07/26/2020    Past Surgical History:  Procedure Laterality Date   CESAREAN SECTION N/A 06/21/2021   Procedure: CESAREAN SECTION;  Surgeon: Azucena Fallen, MD;  Location: [facility];  Service: Obstetrics;  Laterality: N/A;   LAPAROSCOPY N/A 07/26/2020   Procedure: LAPAROSCOPY OPERATIVE;  Surgeon: Janyth Contes, MD;  Location: [facility];  Service: Gynecology;  Laterality: N/A;   OVARIAN CYST REMOVAL Right 07/26/2020   Procedure: OVARIAN CYSTECTOMY;  Surgeon: Janyth Contes, MD;  Location: [facility];  Service: Gynecology;  Laterality: Right;   TONSILLECTOMY       Family History:  Family History  Problem Relation Age of Onset   Hypertension Father    Crohn's disease Father    ADD / ADHD Brother    Heart attack Paternal Uncle    Alcohol abuse Paternal Uncle    Heart attack Paternal Grandfather     Social History:   Social History   Socioeconomic History   Marital status: Married    Spouse name: Not on file   Number of children: Not on file   Years of education: Not on file   Highest education level: Not on file  Occupational History   Not on file  Tobacco Use   Smoking status:  Former    Types: Cigarettes    Quit date: 06/07/2019    Years since quitting: 2.5   Smokeless tobacco: Never  Vaping Use   Vaping Use: Former   Substances: Nicotine  Substance and Sexual Activity   Alcohol use: Not Currently    Comment: ocassionally   Drug use: Never    Types: Marijuana    Comment: no longer using   Sexual activity: Yes    Partners: Male  Other Topics Concern   Not on file  Social History Narrative   ** Merged History Encounter **       Social Determinants of Health   Financial Resource Strain: Not on file  Food Insecurity:  Not on file  Transportation Needs: Not on file  Physical Activity: Not on file  Stress: Not on file  Social Connections: Not on file     Allergies:   Allergies  Allergen Reactions   Tape     Metabolic Disorder Labs: No results found for: "HGBA1C", "MPG" No results found for: "PROLACTIN" No results found for: "CHOL", "TRIG", "HDL", "CHOLHDL", "VLDL", "LDLCALC" No results found for: "TSH"  Therapeutic Level Labs: No results found for: "LITHIUM" No results found for: "CBMZ" No results found for: "VALPROATE"  Current Medications: Current Outpatient Medications  Medication Sig Dispense Refill   acetaminophen (TYLENOL) 325 MG tablet Take 2 tablets (650 mg total) by mouth every 4 (four) hours as needed for mild pain (temperature > 101.5.).     citalopram (CELEXA) 20 MG tablet Take 1 tablet (20 mg total) by mouth daily. 90 tablet 0   doxylamine, Sleep, (UNISOM) 25 MG tablet Take 25 mg by mouth at bedtime as needed.     famotidine (PEPCID) 10 MG tablet Take 10 mg by mouth 2 (two) times daily.     ibuprofen (ADVIL) 600 MG tablet Take 1 tablet (600 mg total) by mouth every 6 (six) hours. 30 tablet 0   oxyCODONE (OXY IR/ROXICODONE) 5 MG immediate release tablet Take 1 tablet (5 mg total) by mouth every 6 (six) hours as needed for moderate pain. 30 tablet 0   pantoprazole (PROTONIX) 20 MG tablet Take 20 mg by mouth daily.     Prenatal Vit-Fe Fumarate-FA (MULTIVITAMIN-PRENATAL) 27-0.8 MG TABS tablet Take 1 tablet by mouth daily at 12 noon.     senna-docusate (SENOKOT-S) 8.6-50 MG tablet Take 2 tablets by mouth daily.     No current facility-administered medications for this visit.      Psychiatric Specialty Exam: Review of Systems  Cardiovascular:  Negative for chest pain.  Psychiatric/Behavioral:  Negative for agitation and behavioral problems. The patient is nervous/anxious.     unknown if currently breastfeeding.There is no height or weight on file to calculate BMI.  General  Appearance: Casual  Eye Contact:  Fair  Speech:  Clear and Coherent  Volume:  Decreased  Mood: fair  Affect:  Constricted  Thought Process:  Goal Directed  Orientation:  Full (Time, Place, and Person)  Thought Content:  Logical  Suicidal Thoughts:  No  Homicidal Thoughts:  No  Memory:  Immediate;   Fair Recent;   Fair  Judgement:  Fair  Insight:  Good  Psychomotor Activity:  Normal  Concentration:  Concentration: Fair and Attention Span: Fair  Recall:  Good  Fund of Knowledge:Good  Language: Good  Akathisia:  No  Handed:   AIMS (if indicated):  not done  Assets:  Communication Skills Desire for Improvement Financial Resources/Insurance Physical Health  ADL's:  Intact  Cognition: WNL  Sleep:   variable to poor   Screenings: PHQ2-9    Flowsheet Row Video Visit from 09/19/2020 in [facility]  PHQ-2 Total Score 0      Flowsheet Row Video Visit from 11/06/2021 in [facility] Video Visit from 09/11/2021 in [facility] Video Visit from 07/17/2021 in [facility] No Risk No Risk No Risk       Assessment and Plan: as follows  Prior documentation reviewed   Panic attacks: denies continue celexa  Plans to lower celexa next visit   Anxiety disorder;having some relavant anxiety due to family stress, continue celexa . Provided supportive therapy, she plans to keep med the same Insomnia manageable with sleep hygiene,   Merian Capron, MD 9/15/20239:35 AM

## 2022-01-28 ENCOUNTER — Telehealth (HOSPITAL_COMMUNITY): Payer: Self-pay | Admitting: Psychiatry

## 2022-01-28 NOTE — Telephone Encounter (Signed)
Patient called requesting letter from provider endorsing need for emotional support animal related to maintaining dog in current housing.

## 2022-01-29 ENCOUNTER — Encounter (HOSPITAL_COMMUNITY): Payer: Self-pay | Admitting: Psychiatry

## 2022-03-26 ENCOUNTER — Telehealth (INDEPENDENT_AMBULATORY_CARE_PROVIDER_SITE_OTHER): Payer: 59 | Admitting: Psychiatry

## 2022-03-26 ENCOUNTER — Encounter (HOSPITAL_COMMUNITY): Payer: Self-pay | Admitting: Psychiatry

## 2022-03-26 DIAGNOSIS — F410 Panic disorder [episodic paroxysmal anxiety] without agoraphobia: Secondary | ICD-10-CM | POA: Diagnosis not present

## 2022-03-26 DIAGNOSIS — F411 Generalized anxiety disorder: Secondary | ICD-10-CM

## 2022-03-26 DIAGNOSIS — F5102 Adjustment insomnia: Secondary | ICD-10-CM

## 2022-03-26 MED ORDER — CITALOPRAM HYDROBROMIDE 20 MG PO TABS
20.0000 mg | ORAL_TABLET | Freq: Every day | ORAL | 0 refills | Status: DC
Start: 2022-03-26 — End: 2022-07-15

## 2022-03-26 NOTE — Progress Notes (Signed)
[place] Follow up visit  Patient Identification: Tiffany Hester MRN:  [id number] Date of Evaluation:  03/26/2022 Referral Source: primary care. Dr. Rosann Auerbach Chief Complaint: follow up  anxiety Visit Diagnosis:    ICD-10-CM   1. GAD (generalized anxiety disorder)  F41.1     2. 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 Panic attack  F41.0      Virtual Visit via Video Note  I connected with Tiffany Hester on 03/26/22 at  2:00 PM EST by a video enabled telemedicine application and verified that I am speaking with the correct person using two identifiers.  Location: Patient: work Provider: home office   I discussed the limitations of evaluation and management by telemedicine and the availability of in person appointments. The patient expressed understanding and agreed to proceed.     I discussed the assessment and treatment plan with the patient. The patient was provided an opportunity to ask questions and all were answered. The patient agreed with the plan and demonstrated an understanding of the instructions.   The patient was advised to call back or seek an in-person evaluation if the symptoms worsen or if the condition fails to improve as anticipated.  I provided  15 minutes of non-face-to-face time during this encounter.    History of Present Illness: 26 years old currently married Caucasian female initially eferred by primary care physician to establish care for anxiety she works as a Art therapist  She is post partum now  9 months with c section  Doing fair, tolerating meds  Denies significant anxiety and bonding is good with baby    Some job stress is there Modifying factors; husband ffamily Severity some anxiety  Past Psychiatric History: anxiety, depression  Previous Psychotropic Medications: Yes   Substance Abuse History in the last 12 months:  No.  Consequences of Substance Abuse: NA  Past Medical History:  Past Medical History:  Diagnosis Date   Anxiety     Asthma    Bilateral ovarian cysts    Chronic UTI (urinary tract infection)    Depression    Panic attack 12/11/2012   Pregnancy induced hypertension    Right ovarian cyst 07/26/2020    Past Surgical History:  Procedure Laterality Date   CESAREAN SECTION N/A 06/21/2021   Procedure: CESAREAN SECTION;  Surgeon: Azucena Fallen, MD;  Location: [place] [facility];  Service: Obstetrics;  Laterality: N/A;   LAPAROSCOPY N/A 07/26/2020   Procedure: LAPAROSCOPY OPERATIVE;  Surgeon: Janyth Contes, MD;  Location: [place];  Service: Gynecology;  Laterality: N/A;   OVARIAN CYST REMOVAL Right 07/26/2020   Procedure: OVARIAN CYSTECTOMY;  Surgeon: Janyth Contes, MD;  Location: [place];  Service: Gynecology;  Laterality: Right;   TONSILLECTOMY       Family History:  Family History  Problem Relation Age of Onset   Hypertension Father    Crohn's disease Father    ADD / ADHD Brother    Heart attack Paternal Uncle    Alcohol abuse Paternal Uncle    Heart attack Paternal Grandfather     Social History:   Social History   Socioeconomic History   Marital status: Married    Spouse name: Not on file   Number of children: Not on file   Years of education: Not on file   Highest education level: Not on file  Occupational History   Not on file  Tobacco Use   Smoking status: Former    Types: Cigarettes    Quit date: 06/07/2019  Years since quitting: 2.8   Smokeless tobacco: Never  Vaping Use   Vaping Use: Former   Substances: Nicotine  Substance and Sexual Activity   Alcohol use: Not Currently    Comment: ocassionally   Drug use: Never    Types: Marijuana    Comment: no longer using   Sexual activity: Yes    Partners: Male  Other Topics Concern   Not on file  Social History Narrative   ** Merged History Encounter **       Social Determinants of Health   Financial Resource Strain: Not on file  Food Insecurity: Not on file  Transportation Needs: Not on file  Physical Activity: Not  on file  Stress: Not on file  Social Connections: Not on file     Allergies:   Allergies  Allergen Reactions   Tape     Metabolic Disorder Labs: No results found for: "HGBA1C", "MPG" No results found for: "PROLACTIN" No results found for: "CHOL", "TRIG", "HDL", "CHOLHDL", "VLDL", "LDLCALC" No results found for: "TSH"  Therapeutic Level Labs: No results found for: "LITHIUM" No results found for: "CBMZ" No results found for: "VALPROATE"  Current Medications: Current Outpatient Medications  Medication Sig Dispense Refill   acetaminophen (TYLENOL) 325 MG tablet Take 2 tablets (650 mg total) by mouth every 4 (four) hours as needed for mild pain (temperature > 101.5.).     citalopram (CELEXA) 20 MG tablet Take 1 tablet (20 mg total) by mouth daily. 90 tablet 0   doxylamine, Sleep, (UNISOM) 25 MG tablet Take 25 mg by mouth at bedtime as needed.     famotidine (PEPCID) 10 MG tablet Take 10 mg by mouth 2 (two) times daily.     ibuprofen (ADVIL) 600 MG tablet Take 1 tablet (600 mg total) by mouth every 6 (six) hours. 30 tablet 0   oxyCODONE (OXY IR/ROXICODONE) 5 MG immediate release tablet Take 1 tablet (5 mg total) by mouth every 6 (six) hours as needed for moderate pain. 30 tablet 0   pantoprazole (PROTONIX) 20 MG tablet Take 20 mg by mouth daily.     Prenatal Vit-Fe Fumarate-FA (MULTIVITAMIN-PRENATAL) 27-0.8 MG TABS tablet Take 1 tablet by mouth daily at 12 noon.     senna-docusate (SENOKOT-S) 8.6-50 MG tablet Take 2 tablets by mouth daily.     No current facility-administered medications for this visit.      Psychiatric Specialty Exam: Review of Systems  Cardiovascular:  Negative for chest pain.  Psychiatric/Behavioral:  Negative for agitation and behavioral problems. The patient is nervous/anxious.     unknown if currently breastfeeding.There is no height or weight on file to calculate BMI.  General Appearance: Casual  Eye Contact:  Fair  Speech:  Clear and Coherent   Volume:  Decreased  Mood: fair  Affect:  Constricted  Thought Process:  Goal Directed  Orientation:  Full (Time, Place, and Person)  Thought Content:  Logical  Suicidal Thoughts:  No  Homicidal Thoughts:  No  Memory:  Immediate;   Fair Recent;   Fair  Judgement:  Fair  Insight:  Good  Psychomotor Activity:  Normal  Concentration:  Concentration: Fair and Attention Span: Fair  Recall:  Good  Fund of Knowledge:Good  Language: Good  Akathisia:  No  Handed:   AIMS (if indicated):  not done  Assets:  Communication Skills Desire for Improvement Financial Resources/Insurance Physical Health  ADL's:  Intact  Cognition: WNL  Sleep:   variable to poor   Screenings: PHQ2-9  Flowsheet Row Video Visit from 09/19/2020 in [place]  PHQ-2 Total Score 0      Flowsheet Row Video Visit from 12/27/2021 in [place] Video Visit from 11/06/2021 in [place] Video Visit from 09/11/2021 in [place] No Risk No Risk No Risk       Assessment and Plan: as follows Prior documentation reviewed   Panic attacks: stable continue celexa, refills sent  Anxiety disorder;having some relavant anxiety with job stress,  Continue meds  Insomnia manageable with sleep hygiene,  Fu 68m  NMerian Capron MD 12/13/20232:00 PM

## 2022-06-30 ENCOUNTER — Encounter (HOSPITAL_COMMUNITY): Payer: Self-pay

## 2022-06-30 ENCOUNTER — Telehealth (HOSPITAL_COMMUNITY): Payer: Self-pay | Admitting: Psychiatry

## 2022-07-02 ENCOUNTER — Telehealth (HOSPITAL_COMMUNITY): Payer: Self-pay | Admitting: Psychiatry

## 2022-07-07 ENCOUNTER — Telehealth (HOSPITAL_COMMUNITY): Payer: Self-pay | Admitting: Psychiatry

## 2022-07-07 NOTE — Telephone Encounter (Signed)
Patient called requesting appointment. Returned call - no answer and voicemail box  is full.

## 2022-07-15 ENCOUNTER — Ambulatory Visit (INDEPENDENT_AMBULATORY_CARE_PROVIDER_SITE_OTHER): Payer: 59 | Admitting: Psychiatry

## 2022-07-15 ENCOUNTER — Encounter (HOSPITAL_COMMUNITY): Payer: Self-pay | Admitting: Psychiatry

## 2022-07-15 VITALS — BP 106/74 | HR 73 | Ht 69.0 in | Wt 139.0 lb

## 2022-07-15 DIAGNOSIS — F5102 Adjustment insomnia: Secondary | ICD-10-CM | POA: Diagnosis not present

## 2022-07-15 DIAGNOSIS — F411 Generalized anxiety disorder: Secondary | ICD-10-CM

## 2022-07-15 DIAGNOSIS — F410 Panic disorder [episodic paroxysmal anxiety] without agoraphobia: Secondary | ICD-10-CM | POA: Diagnosis not present

## 2022-07-15 MED ORDER — CITALOPRAM HYDROBROMIDE 20 MG PO TABS: 20.0000 mg | ORAL_TABLET | Freq: Every day | ORAL | 0 refills | Status: DC

## 2022-07-15 MED ORDER — BUPROPION HCL ER (SR) 100 MG PO TB12: 100.0000 mg | ORAL_TABLET | Freq: Every day | ORAL | 0 refills | Status: DC

## 2022-07-15 NOTE — Progress Notes (Signed)
[facility] Follow up visit  Patient Identification: Tiffany Hester MRN:  [id number] Date of Evaluation:  07/15/2022 Referral Source: primary care. Dr. Rosann Auerbach Chief Complaint: follow up  anxiety Visit Diagnosis:    ICD-10-CM   1. GAD (generalized anxiety disorder)  F41.1     2. 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  Consequences of Substance Abuse: NA  Past Medical History:  Past Medical History:  Diagnosis Date   Anxiety    Asthma    Bilateral ovarian cysts    Chronic UTI (urinary tract infection)    Depression    Panic attack 12/11/2012   Pregnancy induced hypertension    Right ovarian cyst 07/26/2020    Past Surgical History:  Procedure Laterality Date   CESAREAN SECTION N/A 06/21/2021   Procedure: CESAREAN SECTION;  Surgeon: Azucena Fallen, MD;  Location: [place] [facility];  Service: Obstetrics;  Laterality: N/A;   LAPAROSCOPY N/A 07/26/2020   Procedure: LAPAROSCOPY OPERATIVE;  Surgeon: Janyth Contes, MD;  Location: [place];  Service: Gynecology;  Laterality: N/A;   OVARIAN CYST REMOVAL Right 07/26/2020   Procedure: OVARIAN CYSTECTOMY;  Surgeon: Janyth Contes, MD;  Location: [place];  Service: Gynecology;  Laterality: Right;   TONSILLECTOMY       Family  History:  Family History  Problem Relation Age of Onset   Hypertension Father    Crohn's disease Father    ADD / ADHD Brother    Heart attack Paternal Uncle    Alcohol abuse Paternal Uncle    Heart attack Paternal Grandfather     Social History:   Social History   Socioeconomic History   Marital status: Married    Spouse name: Not on file   Number of children: Not on file   Years of education: Not on file   Highest education level: Not on file  Occupational History   Not on file  Tobacco Use   Smoking status: Former    Types: Cigarettes    Quit date: 06/07/2019    Years since quitting: 3.1   Smokeless tobacco: Never  Vaping Use   Vaping Use: Former   Substances: Nicotine  Substance and Sexual Activity   Alcohol use: Not Currently    Comment: ocassionally   Drug use: Never    Types: Marijuana    Comment: no longer using   Sexual activity: Yes    Partners: Male  Other Topics Concern   Not on file  Social History Narrative   ** Merged History Encounter **       Social Determinants of Health   Financial Resource Strain: Not on file  Food Insecurity: Not on file  Transportation Needs: Not on file  Physical Activity:  Not on file  Stress: Not on file  Social Connections: Not on file     Allergies:   Allergies  Allergen Reactions   Tape     Metabolic Disorder Labs: No results found for: "HGBA1C", "MPG" No results found for: "PROLACTIN" No results found for: "CHOL", "TRIG", "HDL", "CHOLHDL", "VLDL", "LDLCALC" No results found for: "TSH"  Therapeutic Level Labs: No results found for: "LITHIUM" No results found for: "CBMZ" No results found for: "VALPROATE"  Current Medications: Current Outpatient Medications  Medication Sig Dispense Refill   buPROPion ER (WELLBUTRIN SR) 100 MG 12 hr tablet Take 1 tablet (100 mg total) by mouth daily. 30 tablet 0   Prenatal Vit-Fe Fumarate-FA (MULTIVITAMIN-PRENATAL) 27-0.8 MG TABS tablet Take 1 tablet by mouth daily at  12 noon.     acetaminophen (TYLENOL) 325 MG tablet Take 2 tablets (650 mg total) by mouth every 4 (four) hours as needed for mild pain (temperature > 101.5.). (Patient not taking: Reported on 07/15/2022)     citalopram (CELEXA) 20 MG tablet Take 1 tablet (20 mg total) by mouth daily. 90 tablet 0   doxylamine, Sleep, (UNISOM) 25 MG tablet Take 25 mg by mouth at bedtime as needed. (Patient not taking: Reported on 07/15/2022)     famotidine (PEPCID) 10 MG tablet Take 10 mg by mouth 2 (two) times daily. (Patient not taking: Reported on 07/15/2022)     ibuprofen (ADVIL) 600 MG tablet Take 1 tablet (600 mg total) by mouth every 6 (six) hours. (Patient not taking: Reported on 07/15/2022) 30 tablet 0   oxyCODONE (OXY IR/ROXICODONE) 5 MG immediate release tablet Take 1 tablet (5 mg total) by mouth every 6 (six) hours as needed for moderate pain. (Patient not taking: Reported on 07/15/2022) 30 tablet 0   pantoprazole (PROTONIX) 20 MG tablet Take 20 mg by mouth daily. (Patient not taking: Reported on 07/15/2022)     senna-docusate (SENOKOT-S) 8.6-50 MG tablet Take 2 tablets by mouth daily. (Patient not taking: Reported on 07/15/2022)     No current facility-administered medications for this visit.      Psychiatric Specialty Exam: Review of Systems  Cardiovascular:  Negative for chest pain.  Psychiatric/Behavioral:  Negative for agitation and behavioral problems.     Blood pressure 106/74, pulse 73, height 5\' 9"  (1.753 m), weight 139 lb (63 kg), SpO2 98 %, currently breastfeeding.Body mass index is 20.53 kg/m.  General Appearance: Casual  Eye Contact:  Fair  Speech:  Clear and Coherent  Volume:  Decreased  Mood: fair  Affect:  Constricted  Thought Process:  Goal Directed  Orientation:  Full (Time, Place, and Person)  Thought Content:  Logical  Suicidal Thoughts:  No  Homicidal Thoughts:  No  Memory:  Immediate;   Fair Recent;   Fair  Judgement:  Fair  Insight:  Good  Psychomotor Activity:  Normal   Concentration:  Concentration: Fair and Attention Span: Fair  Recall:  Good  Fund of Knowledge:Good  Language: Good  Akathisia:  No  Handed:   AIMS (if indicated):  not done  Assets:  Communication Skills Desire for Improvement Financial Resources/Insurance Physical Health  ADL's:  Intact  Cognition: WNL  Sleep:   variable to poor   Screenings: PHQ2-9    Flowsheet Row Video Visit from 09/19/2020 in [facility]  PHQ-2 Total Score 0      Flowsheet Row Video Visit from 12/27/2021 in [place] at [facility] Video Visit from  11/06/2021 in [place] at [facility] Video Visit from 09/11/2021 in [facility] [place] No Risk No Risk No Risk       Assessment and Plan: as follows  Prior documentation reviewed   Panic attacks: fair continue celexa   Generalized Anxiety disorder;having some relavant anxiety , job stress and husband see above Continue celexa not time to taper off   Decreased sexual desire: possible related to SSRI, discussed options will add wellbutrin for the side effects, small dose and review 4 - 6 weeks  Insomnia manageable with sleep hygiene, not elaborated concerns Direct care time spent 20 min including face to face, chart review, documentation  Fu 6 weeks   Merian Capron, MD 4/2/20244:22 PM

## 2022-08-13 ENCOUNTER — Telehealth (HOSPITAL_COMMUNITY): Payer: Self-pay

## 2022-08-13 MED ORDER — BUPROPION HCL ER (SR) 100 MG PO TB12: 100.0000 mg | ORAL_TABLET | Freq: Every day | ORAL | 0 refills | Status: DC

## 2022-08-13 NOTE — Telephone Encounter (Signed)
Medication refill - Fax from patient's [facility] requesting a new Bupropion SR 100 mg order, last provided with no refills when seen 07/15/22 and patient returns next on 09/01/22.

## 2022-09-01 ENCOUNTER — Encounter (HOSPITAL_COMMUNITY): Payer: Self-pay | Admitting: Psychiatry

## 2022-09-01 ENCOUNTER — Telehealth (INDEPENDENT_AMBULATORY_CARE_PROVIDER_SITE_OTHER): Payer: 59 | Admitting: Psychiatry

## 2022-09-01 DIAGNOSIS — F410 Panic disorder [episodic paroxysmal anxiety] without agoraphobia: Secondary | ICD-10-CM | POA: Diagnosis not present

## 2022-09-01 DIAGNOSIS — F5102 Adjustment insomnia: Secondary | ICD-10-CM

## 2022-09-01 DIAGNOSIS — F411 Generalized anxiety disorder: Secondary | ICD-10-CM | POA: Diagnosis not present

## 2022-09-01 MED ORDER — BUPROPION HCL ER (SR) 100 MG PO TB12: 100.0000 mg | ORAL_TABLET | Freq: Every day | ORAL | 0 refills | Status: DC

## 2022-09-01 NOTE — Progress Notes (Signed)
[facility] Follow up visit  Patient Identification: Tiffany Hester MRN:  [id number] Date of Evaluation:  09/01/2022 Referral Source: primary care. Dr. Mindi Junker Chief Complaint: follow up  anxiety Visit Diagnosis:    ICD-10-CM   1. GAD (generalized anxiety disorder)  F41.1     2. 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 Panic attack  F41.0     Virtual Visit via Video Note  I connected with Tiffany Hester on 09/01/22 at 12:30 PM EDT by a video enabled telemedicine application and verified that I am speaking with the correct person using two identifiers.  Location: Patient: work Provider: home office   I discussed the limitations of evaluation and management by telemedicine and the availability of in person appointments. The patient expressed understanding and agreed to proceed.     I discussed the assessment and treatment plan with the patient. The patient was provided an opportunity to ask questions and all were answered. The patient agreed with the plan and demonstrated an understanding of the instructions.   The patient was advised to call back or seek an in-person evaluation if the symptoms worsen or if the condition fails to improve as anticipated.  I provided 15 minutes of non-face-to-face time during this encounter.    History of Present Illness: 27 years old currently married Caucasian female initially eferred by primary care physician to establish care for anxiety she works as a Sales executive  She is post partum more then one year  Added wellbutrin last visit for sexual side effet of celexa  Has not used regularly so wats to give more time to it  Overall jobs stress is there but handling it   Bonding is good with baby  Initially had worries related to baby post partum   Some job stress is there Modifying factors;husband, family Severity  can get anxious  Past Psychiatric History: anxiety, depression  Previous Psychotropic Medications: Yes   Substance Abuse History in  the last 12 months:  No.  Consequences of Substance Abuse: NA  Past Medical History:  Past Medical History:  Diagnosis Date   Anxiety    Asthma    Bilateral ovarian cysts    Chronic UTI (urinary tract infection)    Depression    Panic attack 12/11/2012   Pregnancy induced hypertension    Right ovarian cyst 07/26/2020    Past Surgical History:  Procedure Laterality Date   CESAREAN SECTION N/A 06/21/2021   Procedure: CESAREAN SECTION;  Surgeon: Shea Evans, MD;  Location: [facility];  Service: Obstetrics;  Laterality: N/A;   LAPAROSCOPY N/A 07/26/2020   Procedure: LAPAROSCOPY OPERATIVE;  Surgeon: Sherian Rein, MD;  Location: [facility];  Service: Gynecology;  Laterality: N/A;   OVARIAN CYST REMOVAL Right 07/26/2020   Procedure: OVARIAN CYSTECTOMY;  Surgeon: Sherian Rein, MD;  Location: [facility];  Service: Gynecology;  Laterality: Right;   TONSILLECTOMY       Family History:  Family History  Problem Relation Age of Onset   Hypertension Father    Crohn's disease Father    ADD / ADHD Brother    Heart attack Paternal Uncle    Alcohol abuse Paternal Uncle    Heart attack Paternal Grandfather     Social History:   Social History   Socioeconomic History   Marital status: Married    Spouse name: Not on file   Number of children: Not on file   Years of education: Not on file   Highest education level: Not on file  Occupational History   Not on file  Tobacco Use   Smoking status: Former    Types: Cigarettes    Quit date: 06/07/2019    Years since quitting: 3.2   Smokeless tobacco: Never  Vaping Use   Vaping Use: Former   Substances: Nicotine  Substance and Sexual Activity   Alcohol use: Not Currently    Comment: ocassionally   Drug use: Never    Types: Marijuana    Comment: no longer using   Sexual activity: Yes    Partners: Male  Other Topics Concern   Not on file  Social History Narrative   ** Merged History Encounter **       Social  Determinants of Health   Financial Resource Strain: Not on file  Food Insecurity: Not on file  Transportation Needs: Not on file  Physical Activity: Not on file  Stress: Not on file  Social Connections: Not on file     Allergies:   Allergies  Allergen Reactions   Tape     Metabolic Disorder Labs: No results found for: "HGBA1C", "MPG" No results found for: "PROLACTIN" No results found for: "CHOL", "TRIG", "HDL", "CHOLHDL", "VLDL", "LDLCALC" No results found for: "TSH"  Therapeutic Level Labs: No results found for: "LITHIUM" No results found for: "CBMZ" No results found for: "VALPROATE"  Current Medications: Current Outpatient Medications  Medication Sig Dispense Refill   acetaminophen (TYLENOL) 325 MG tablet Take 2 tablets (650 mg total) by mouth every 4 (four) hours as needed for mild pain (temperature > 101.5.). (Patient not taking: Reported on 07/15/2022)     buPROPion ER (WELLBUTRIN SR) 100 MG 12 hr tablet Take 1 tablet (100 mg total) by mouth daily. 30 tablet 0   citalopram (CELEXA) 20 MG tablet Take 1 tablet (20 mg total) by mouth daily. 90 tablet 0   doxylamine, Sleep, (UNISOM) 25 MG tablet Take 25 mg by mouth at bedtime as needed. (Patient not taking: Reported on 07/15/2022)     famotidine (PEPCID) 10 MG tablet Take 10 mg by mouth 2 (two) times daily. (Patient not taking: Reported on 07/15/2022)     ibuprofen (ADVIL) 600 MG tablet Take 1 tablet (600 mg total) by mouth every 6 (six) hours. (Patient not taking: Reported on 07/15/2022) 30 tablet 0   oxyCODONE (OXY IR/ROXICODONE) 5 MG immediate release tablet Take 1 tablet (5 mg total) by mouth every 6 (six) hours as needed for moderate pain. (Patient not taking: Reported on 07/15/2022) 30 tablet 0   pantoprazole (PROTONIX) 20 MG tablet Take 20 mg by mouth daily. (Patient not taking: Reported on 07/15/2022)     Prenatal Vit-Fe Fumarate-FA (MULTIVITAMIN-PRENATAL) 27-0.8 MG TABS tablet Take 1 tablet by mouth daily at 12 noon.      senna-docusate (SENOKOT-S) 8.6-50 MG tablet Take 2 tablets by mouth daily. (Patient not taking: Reported on 07/15/2022)     No current facility-administered medications for this visit.      Psychiatric Specialty Exam: Review of Systems  Cardiovascular:  Negative for chest pain.  Psychiatric/Behavioral:  Negative for agitation and behavioral problems.     currently breastfeeding.There is no height or weight on file to calculate BMI.  General Appearance: Casual  Eye Contact:  Fair  Speech:  Clear and Coherent  Volume:  Decreased  Mood: fair  Affect:  Constricted  Thought Process:  Goal Directed  Orientation:  Full (Time, Place, and Person)  Thought Content:  Logical  Suicidal Thoughts:  No  Homicidal Thoughts:  No  Memory:  Immediate;   Fair Recent;   Fair  Judgement:  Fair  Insight:  Good  Psychomotor Activity:  Normal  Concentration:  Concentration: Fair and Attention Span: Fair  Recall:  Good  Fund of Knowledge:Good  Language: Good  Akathisia:  No  Handed:   AIMS (if indicated):  not done  Assets:  Communication Skills Desire for Improvement Financial Resources/Insurance Physical Health  ADL's:  Intact  Cognition: WNL  Sleep:   variable to poor   Screenings: PHQ2-9    Flowsheet Row Video Visit from 09/19/2020 in [facility] at [facility]  PHQ-2 Total Score 0      Flowsheet Row Video Visit from 12/27/2021 in [facility] at [facility] Video Visit from 11/06/2021 in [facility] at [facility] Video Visit from 09/11/2021 in [facility] at [facility]  C-SSRS RISK CATEGORY No Risk No Risk No Risk       Assessment and Plan: as follows   Prior documentation reviewed   Panic attacks: fair continue celexa   Generalized Anxiety disorder; manageable, some job stress but doing ok continue celexa  Decreased  sexual desire: possible related to SSRI, She wants to continue wellbutrin and take regularly see its effect Insomnia manageable with sleep hygiene, not elaborated concerns  Fu 2 - 74m.   Thresa Ross, MD 5/20/202412:40 PM

## 2022-09-29 ENCOUNTER — Telehealth (HOSPITAL_COMMUNITY): Payer: Self-pay | Admitting: *Deleted

## 2022-09-29 MED ORDER — CITALOPRAM HYDROBROMIDE 20 MG PO TABS: 20.0000 mg | ORAL_TABLET | Freq: Every day | ORAL | 0 refills | Status: DC

## 2022-09-29 NOTE — Telephone Encounter (Signed)
[facility] [address] [place]   citalopram (CELEXA) 20 MG tablet [[id number]]   Order Details Dose: 20 mg Route: Oral Frequency: Daily  Dispense Quantity: 90 tablet Refills: 0   Note to Pharmacy: Hold till due       Sig: Take 1 tablet (20 mg total) by mouth daily.   Last Appt 09/01/22 Next Appt  11/24/22

## 2022-09-29 NOTE — Addendum Note (Signed)
Addended by: Thresa Ross on: 09/29/2022 03:00 PM   Modules accepted: Orders

## 2022-10-01 ENCOUNTER — Telehealth (HOSPITAL_COMMUNITY): Payer: Self-pay | Admitting: *Deleted

## 2022-10-01 MED ORDER — BUPROPION HCL ER (SR) 100 MG PO TB12: 100.0000 mg | ORAL_TABLET | Freq: Every day | ORAL | 0 refills | Status: DC

## 2022-10-01 NOTE — Addendum Note (Signed)
Addended by: Thresa Ross on: 10/01/2022 12:29 PM   Modules accepted: Orders

## 2022-10-01 NOTE — Telephone Encounter (Signed)
buPROPion ER (WELLBUTRIN SR) 100 MG 12 hr tablet [[id number]]   Order Details  Dose: 100 mg Route: Oral Frequency: Daily  Dispense Quantity: 30 tablet Refills: 0   Indications of Use: SSRI-Induced Sexual Dysfunction       Sig: Take 1 tablet (100 mg total) by mouth daily.        [id number] - [place], [place] - [address] AT [facility] [address]   Last appt--06/03/22 Next appt-- 11/24/22

## 2022-10-29 ENCOUNTER — Telehealth (HOSPITAL_COMMUNITY): Payer: Self-pay | Admitting: *Deleted

## 2022-10-29 MED ORDER — BUPROPION HCL ER (SR) 100 MG PO TB12
100.0000 mg | ORAL_TABLET | Freq: Every day | ORAL | 0 refills | Status: DC
Start: 2022-10-29 — End: 2022-11-24

## 2022-10-29 NOTE — Addendum Note (Signed)
Addended by: Thresa Ross on: 10/29/2022 08:25 AM   Modules accepted: Orders

## 2022-10-29 NOTE — Telephone Encounter (Signed)
Rx Refill Request buPROPion ER (WELLBUTRIN SR) 100 MG 12 hr tablet [[id number]]   Order Details   Dose: 100 mg Route: Oral Frequency: Daily  Dispense Quantity: 30 tablet Refills: 0    Indications of Use: SSRI-Induced Sexual Dysfunction         Sig: Take 1 tablet (100 mg total) by mouth daily.           [id number] - [place], [place] - [address] AT [facility] [address]    Last appt--06/03/22 Next appt-- 11/24/22

## 2022-11-24 ENCOUNTER — Telehealth (HOSPITAL_COMMUNITY): Payer: 59 | Admitting: Psychiatry

## 2022-11-24 ENCOUNTER — Encounter (HOSPITAL_COMMUNITY): Payer: Self-pay | Admitting: Psychiatry

## 2022-11-24 DIAGNOSIS — F410 Panic disorder [episodic paroxysmal anxiety] without agoraphobia: Secondary | ICD-10-CM

## 2022-11-24 DIAGNOSIS — F411 Generalized anxiety disorder: Secondary | ICD-10-CM | POA: Diagnosis not present

## 2022-11-24 DIAGNOSIS — F5102 Adjustment insomnia: Secondary | ICD-10-CM

## 2022-11-24 MED ORDER — CITALOPRAM HYDROBROMIDE 10 MG PO TABS: 10.0000 mg | ORAL_TABLET | Freq: Every day | ORAL | 0 refills | Status: DC

## 2022-11-24 NOTE — Progress Notes (Signed)
[facility] Follow up visit  Patient Identification: Tiffany Hester MRN:  [id number] Date of Evaluation:  11/24/2022 Referral Source: primary care. Dr. Mindi Junker Chief Complaint: follow up  anxiety Visit Diagnosis:    ICD-10-CM   1. GAD (generalized anxiety disorder)  F41.1     2. 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 Panic attack  F41.0       Virtual Visit via Video Note  I connected with [name] on 11/24/22 at 12:30 PM EDT by a video enabled telemedicine application and verified that I am speaking with the correct person using two identifiers.  Location: Patient: parked car Provider: home office   I discussed the limitations of evaluation and management by telemedicine and the availability of in person appointments. The patient expressed understanding and agreed to proceed.      I discussed the assessment and treatment plan with the patient. The patient was provided an opportunity to ask questions and all were answered. The patient agreed with the plan and demonstrated an understanding of the instructions.   The patient was advised to call back or seek an in-person evaluation if the symptoms worsen or if the condition fails to improve as anticipated.  I provided 20 minutes of non-face-to-face time during this encounter.   History of Present Illness: 27 years old currently married Caucasian female initially eferred by primary care physician to establish care for anxiety she works as a Sales executive  She is post partum 17 months. Last visit started wellbutrin for sexual side effects  She is now pregnant [redacted] weeks, she self stopped wellbutrin 2 weeks ago when she found about pregnancy and have cut down celexa to 10mg  She has had celexa 10mg  last pregnancy and did well  Husband supportive, job stress manageable   Bonding is good with baby   Some job stress is there Modifying factors;husband, family Severity  some anxiety  Past Psychiatric History: anxiety,  depression  Previous Psychotropic Medications: Yes   Substance Abuse History in the last 12 months:  No.  Consequences of Substance Abuse: NA  Past Medical History:  Past Medical History:  Diagnosis Date   Anxiety    Asthma    Bilateral ovarian cysts    Chronic UTI (urinary tract infection)    Depression    Panic attack 12/11/2012   Pregnancy induced hypertension    Right ovarian cyst 07/26/2020    Past Surgical History:  Procedure Laterality Date   CESAREAN SECTION N/A 06/21/2021   Procedure: CESAREAN SECTION;  Surgeon: Shea Evans, MD;  [facility]: [facility];  Service: Obstetrics;  Laterality: N/A;   LAPAROSCOPY N/A 07/26/2020   Procedure: LAPAROSCOPY OPERATIVE;  Surgeon: Sherian Rein, MD;  Location: [facility] OR;  Service: Gynecology;  Laterality: N/A;   OVARIAN CYST REMOVAL Right 07/26/2020   Procedure: OVARIAN CYSTECTOMY;  Surgeon: Sherian Rein, MD;  Location: [facility] OR;  Service: Gynecology;  Laterality: Right;   TONSILLECTOMY       Family History:  Family History  Problem Relation Age of Onset   Hypertension Father    Crohn's disease Father    ADD / ADHD Brother    Heart attack Paternal Uncle    Alcohol abuse Paternal Uncle    Heart attack Paternal Grandfather     Social History:   Social History   Socioeconomic History   Marital status: Married    Spouse name: Not on file   Number of children: Not on file   Years of education: Not on file  Highest education level: Not on file  Occupational History   Not on file  Tobacco Use   Smoking status: Former    Current packs/day: 0.00    Types: Cigarettes    Quit date: 06/07/2019    Years since quitting: 3.4   Smokeless tobacco: Never  Vaping Use   Vaping status: Former   Substances: Nicotine  Substance and Sexual Activity   Alcohol use: Not Currently    Comment: ocassionally   Drug use: Never    Types: Marijuana    Comment: no longer using   Sexual activity: Yes    Partners: Male  Other  Topics Concern   Not on file  Social History Narrative   ** Merged History Encounter **       Social Determinants of Health   Financial Resource Strain: Low Risk  (04/17/2022)   Received from [facility], [facility]   Overall Financial Resource Strain (CARDIA)    Difficulty of Paying Living Expenses: Not hard at all  Food Insecurity: No Food Insecurity (04/17/2022)   Received from [facility], [facility]   Hunger Vital Sign    Worried About Running Out of Food in the Last Year: Never true    Ran Out of Food in the Last Year: Never true  Transportation Needs: No Transportation Needs (04/17/2022)   Received from [facility], [facility]   PRAPARE - Transportation    Lack of Transportation (Medical): No    Lack of Transportation (Non-Medical): No  Physical Activity: Not on file  Stress: Not on file  Social Connections: Unknown (08/26/2021)   Received from [facility], [facility]   Social Network    Social Network: Not on file     Allergies:   Allergies  Allergen Reactions   Tape     Metabolic Disorder Labs: No results found for: "HGBA1C", "MPG" No results found for: "PROLACTIN" No results found for: "CHOL", "TRIG", "HDL", "CHOLHDL", "VLDL", "LDLCALC" No results found for: "TSH"  Therapeutic Level Labs: No results found for: "LITHIUM" No results found for: "CBMZ" No results found for: "VALPROATE"  Current Medications: Current Outpatient Medications  Medication Sig Dispense Refill   acetaminophen (TYLENOL) 325 MG tablet Take 2 tablets (650 mg total) by mouth every 4 (four) hours as needed for mild pain (temperature > 101.5.). (Patient not taking: Reported on 07/15/2022)     citalopram (CELEXA) 10 MG tablet Take 1 tablet (10 mg total) by mouth daily. 90 tablet 0   doxylamine, Sleep, (UNISOM) 25 MG tablet Take 25 mg by mouth at bedtime as needed. (Patient not taking: Reported on 07/15/2022)     famotidine (PEPCID) 10 MG tablet Take 10 mg by mouth 2 (two) times  daily. (Patient not taking: Reported on 07/15/2022)     ibuprofen (ADVIL) 600 MG tablet Take 1 tablet (600 mg total) by mouth every 6 (six) hours. (Patient not taking: Reported on 07/15/2022) 30 tablet 0   oxyCODONE (OXY IR/ROXICODONE) 5 MG immediate release tablet Take 1 tablet (5 mg total) by mouth every 6 (six) hours as needed for moderate pain. (Patient not taking: Reported on 07/15/2022) 30 tablet 0   pantoprazole (PROTONIX) 20 MG tablet Take 20 mg by mouth daily. (Patient not taking: Reported on 07/15/2022)     Prenatal Vit-Fe Fumarate-FA (MULTIVITAMIN-PRENATAL) 27-0.8 MG TABS tablet Take 1 tablet by mouth daily at 12 noon.     senna-docusate (SENOKOT-S) 8.6-50 MG tablet Take 2 tablets by mouth daily. (Patient not taking: Reported on 07/15/2022)  No current facility-administered medications for this visit.      Psychiatric Specialty Exam: Review of Systems  Cardiovascular:  Negative for chest pain.  Psychiatric/Behavioral:  Negative for agitation and behavioral problems.     currently breastfeeding.There is no height or weight on file to calculate BMI.  General Appearance: Casual  Eye Contact:  Fair  Speech:  Clear and Coherent  Volume:  Decreased  Mood: fair  Affect:  Constricted  Thought Process:  Goal Directed  Orientation:  Full (Time, Place, and Person)  Thought Content:  Logical  Suicidal Thoughts:  No  Homicidal Thoughts:  No  Memory:  Immediate;   Fair Recent;   Fair  Judgement:  Fair  Insight:  Good  Psychomotor Activity:  Normal  Concentration:  Concentration: Fair and Attention Span: Fair  Recall:  Good  Fund of Knowledge:Good  Language: Good  Akathisia:  No  Handed:   AIMS (if indicated):  not done  Assets:  Communication Skills Desire for Improvement Financial Resources/Insurance Physical Health  ADL's:  Intact  Cognition: WNL  Sleep:   variable to poor   Screenings: PHQ2-9    Flowsheet Row Video Visit from 09/19/2020 in [place] at [facility]  PHQ-2 Total Score 0      Flowsheet Row Video Visit from 12/27/2021 in [facility] at [facility] Video Visit from 11/06/2021 in [facility] at [facility] Video Visit from 09/11/2021 in [facility] at [facility]  C-SSRS RISK CATEGORY No Risk No Risk No Risk       Assessment and Plan: as follows   Prior documentation reviewed    Panic attacks: manageable, now on celexa 10mg , understands the risk and benefit of being on med while pregnant. She is following with OB and getting treatment for nausea.    Generalized Anxiety disorder; fair on celexa 10mg   Decreased sexual desire: not concerned today, wellbutrin has been stopped, she is pregnant  Insomnia : did not endorse concerns, continue sleep hygiene  Fu 28m.   Thresa Ross, MD 8/12/202412:52 PM

## 2022-12-08 LAB — HEPATITIS C ANTIBODY: HCV Ab: NEGATIVE

## 2022-12-08 LAB — OB RESULTS CONSOLE RUBELLA ANTIBODY, IGM: Rubella: IMMUNE

## 2022-12-08 LAB — OB RESULTS CONSOLE RPR: RPR: NONREACTIVE

## 2022-12-08 LAB — OB RESULTS CONSOLE HIV ANTIBODY (ROUTINE TESTING): HIV: NONREACTIVE

## 2022-12-08 LAB — OB RESULTS CONSOLE GC/CHLAMYDIA
Chlamydia: NEGATIVE
Neisseria Gonorrhea: NEGATIVE

## 2022-12-08 LAB — OB RESULTS CONSOLE HEPATITIS B SURFACE ANTIGEN: Hepatitis B Surface Ag: NEGATIVE

## 2022-12-17 IMAGING — US US MFM OB LIMITED
1 series · 15 of 28 positions shown · non-contrast
Comparison: none

[Series 1: us mfm ob limited · 29 acquisitions, 15 frames shown]
[im 1/29]
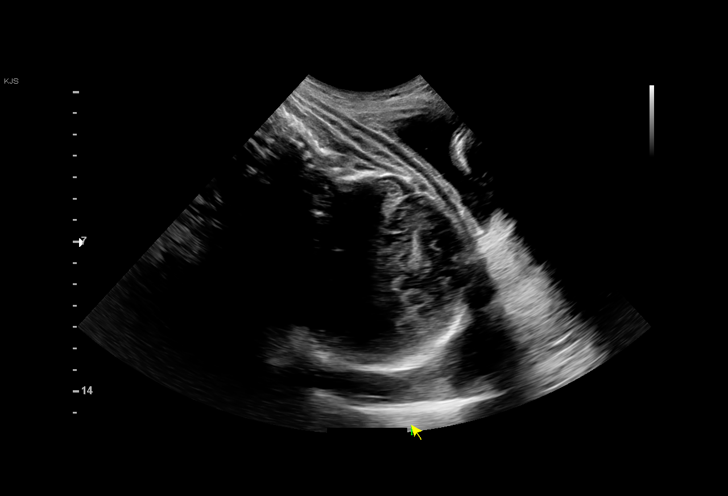
[im 3/29]
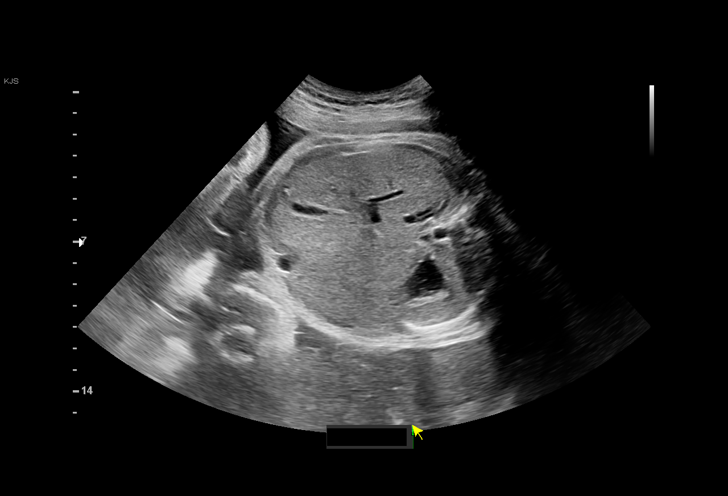
[im 5/29]
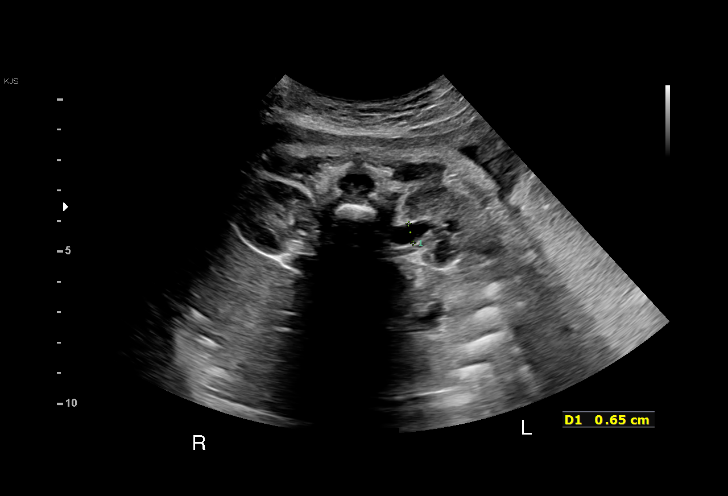
[im 7/29]
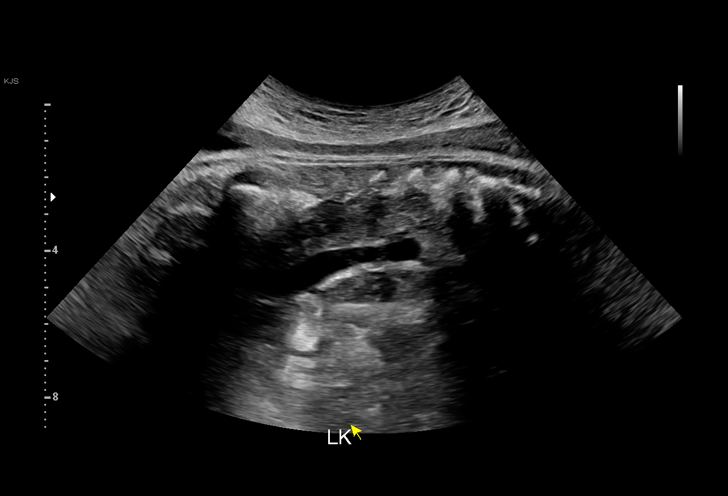
[im 9/29]
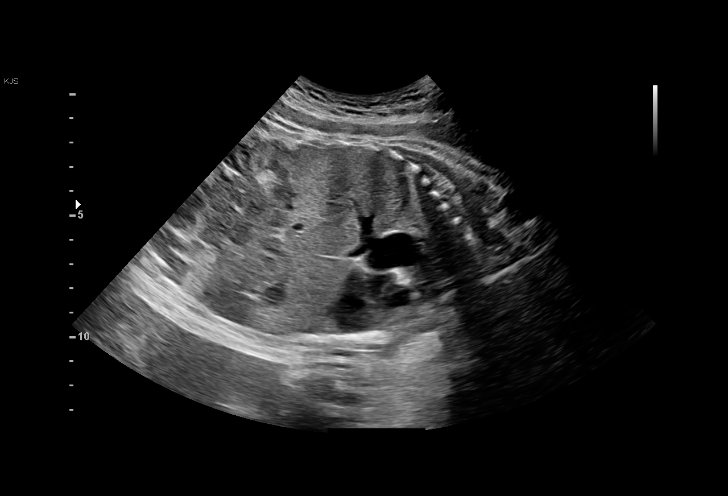
[im 11/29]
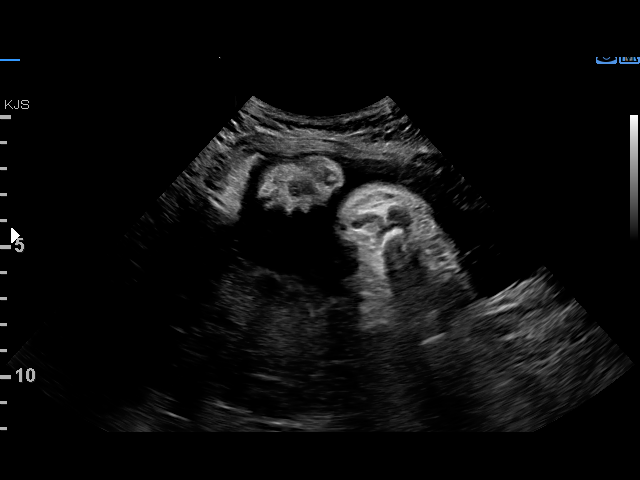
[im 13/29]
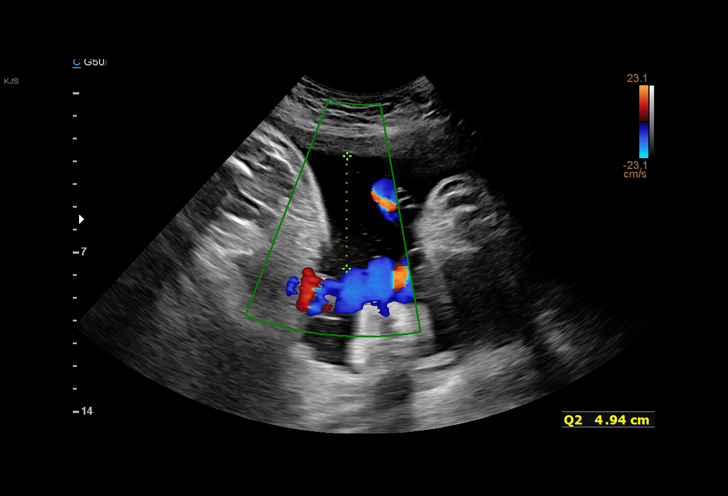
[im 15/29]
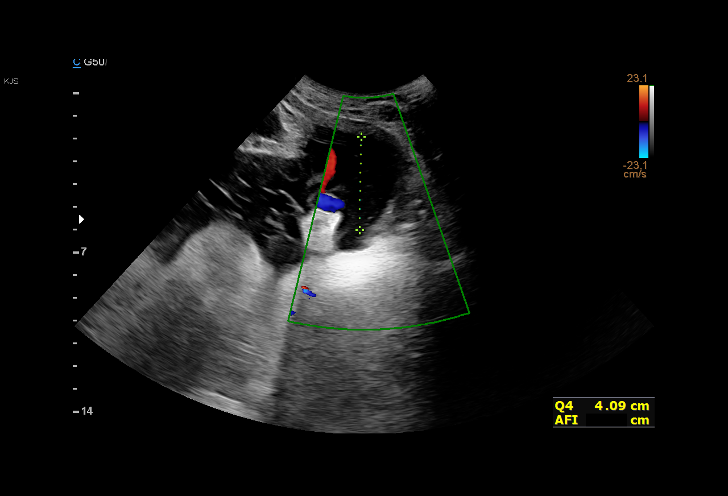
[im 16/29]
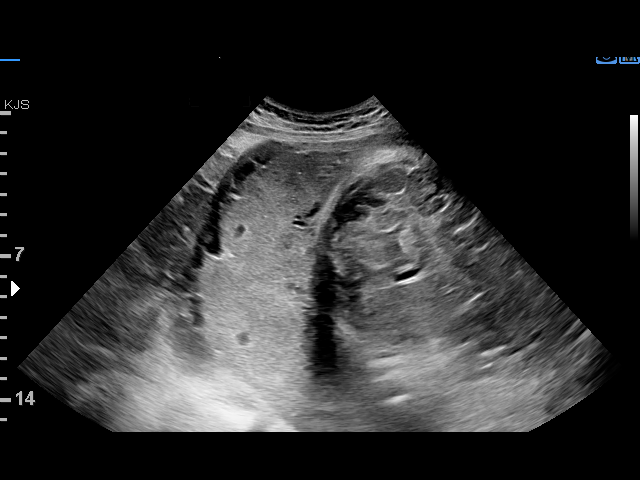
[im 18/29]
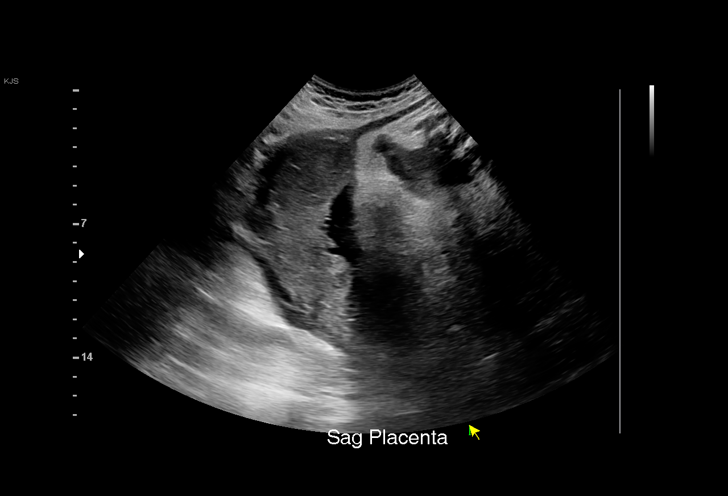
[im 20/29]
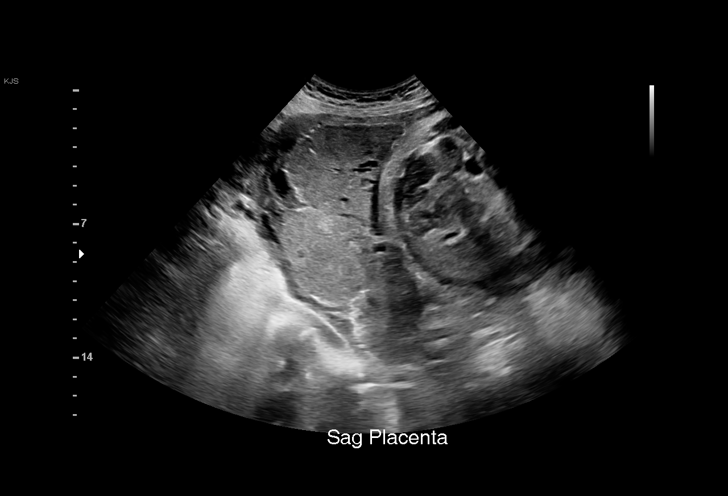
[im 22/29]
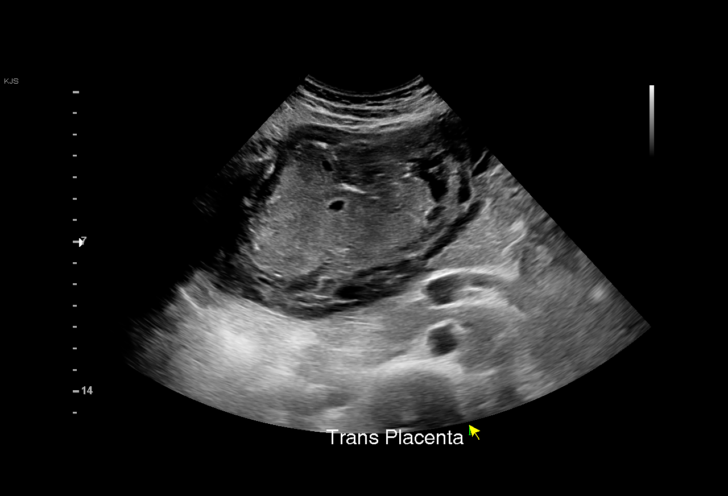
[im 24/29]
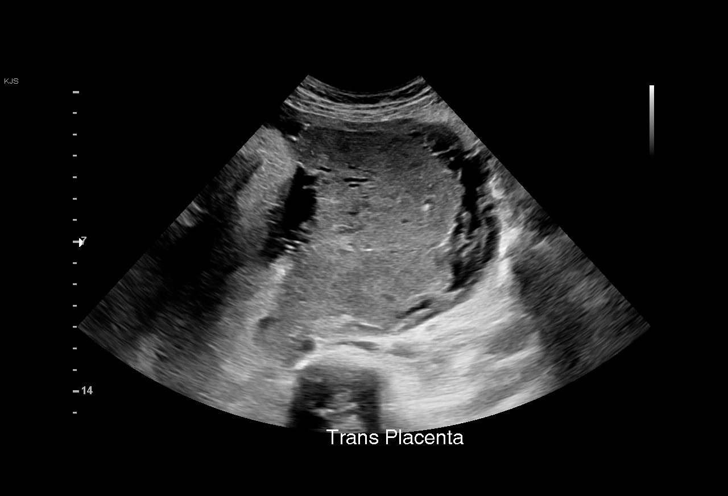
[im 26/29]
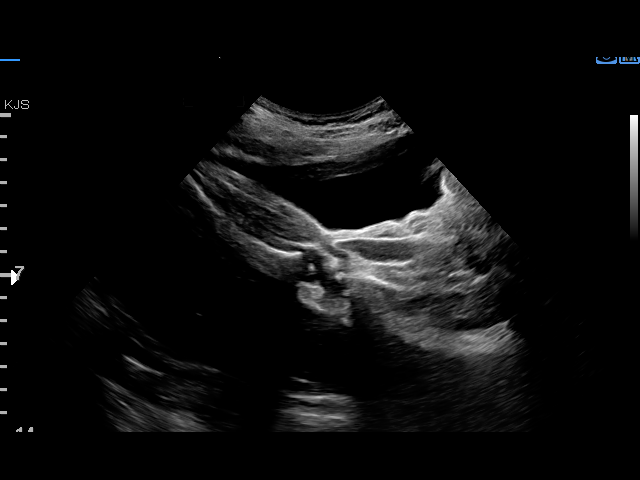
[im 29/29]
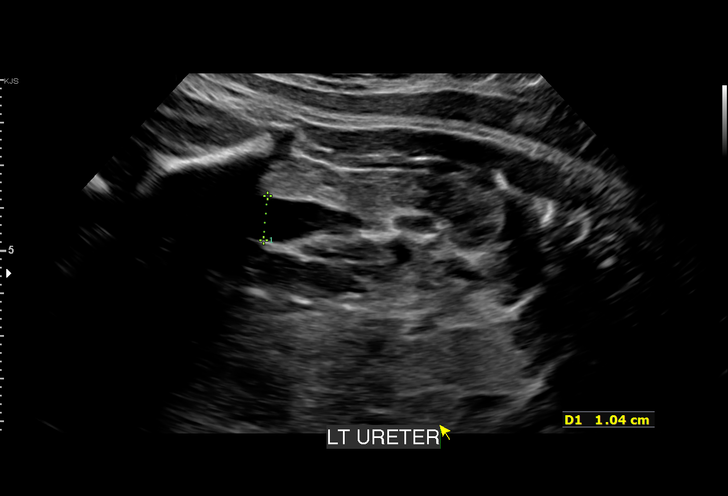

[15 of 28 positions shown; findings below may reference images not displayed]

CNM

Indications

 Leakage of amniotic fluid
 38 weeks gestation of pregnancy
Fetal Evaluation

 Num Of Fetuses:         1
 Fetal Heart Rate(bpm):  131
 Cardiac Activity:       Observed
 Presentation:           Cephalic
 Placenta:               Posterior Fundal
 P. Cord Insertion:      Not well visualized

 Amniotic Fluid
 AFI FV:      Within normal limits

 AFI Sum(cm)     %Tile       Largest Pocket(cm)
 17.9            72

 RUQ(cm)       RLQ(cm)       LUQ(cm)        LLQ(cm)

OB History

 Gravidity:    1         Term:   0        Prem:   0        SAB:   0
 TOP:          0       Ectopic:  0        Living: 0
Gestational Age

 Clinical EDD:  38w 5d                                        EDD:   06/27/21
 Best:          38w 5d     Det. By:  Clinical EDD             EDD:   06/27/21
Anatomy
 Diaphragm:             Appears normal         Kidneys:                Left hydroureter,
                                                                       10mm
 Stomach:               Appears normal, left   Bladder:                Appears normal
                        sided
Impression

 Limited exam to assess possible rupture of membranes.
 Good fetal movement and amniotic fluid observed.
 Left hydroureter is seen with prominent left renal pelvis of
 mm which is normal for this gestational age.

 However, given the dilated hydroureter I recommend Cardenas Garcia
 Bilian evaluation of the fetal genitourinary system.
Recommendations

 Postnatal evaluation recommended.

## 2022-12-20 IMAGING — DX DG ABD PORTABLE 1V
1 series · 2 of 2 positions shown · non-contrast
Comparison: 07/05/2015

CLINICAL DATA: Sponge count/foreign body.

EXAM:
PORTABLE ABDOMEN - 1 VIEW

[Series 1: abdomen · 0.14mm/px · 2 of 2 slices shown]
[im 1/2]
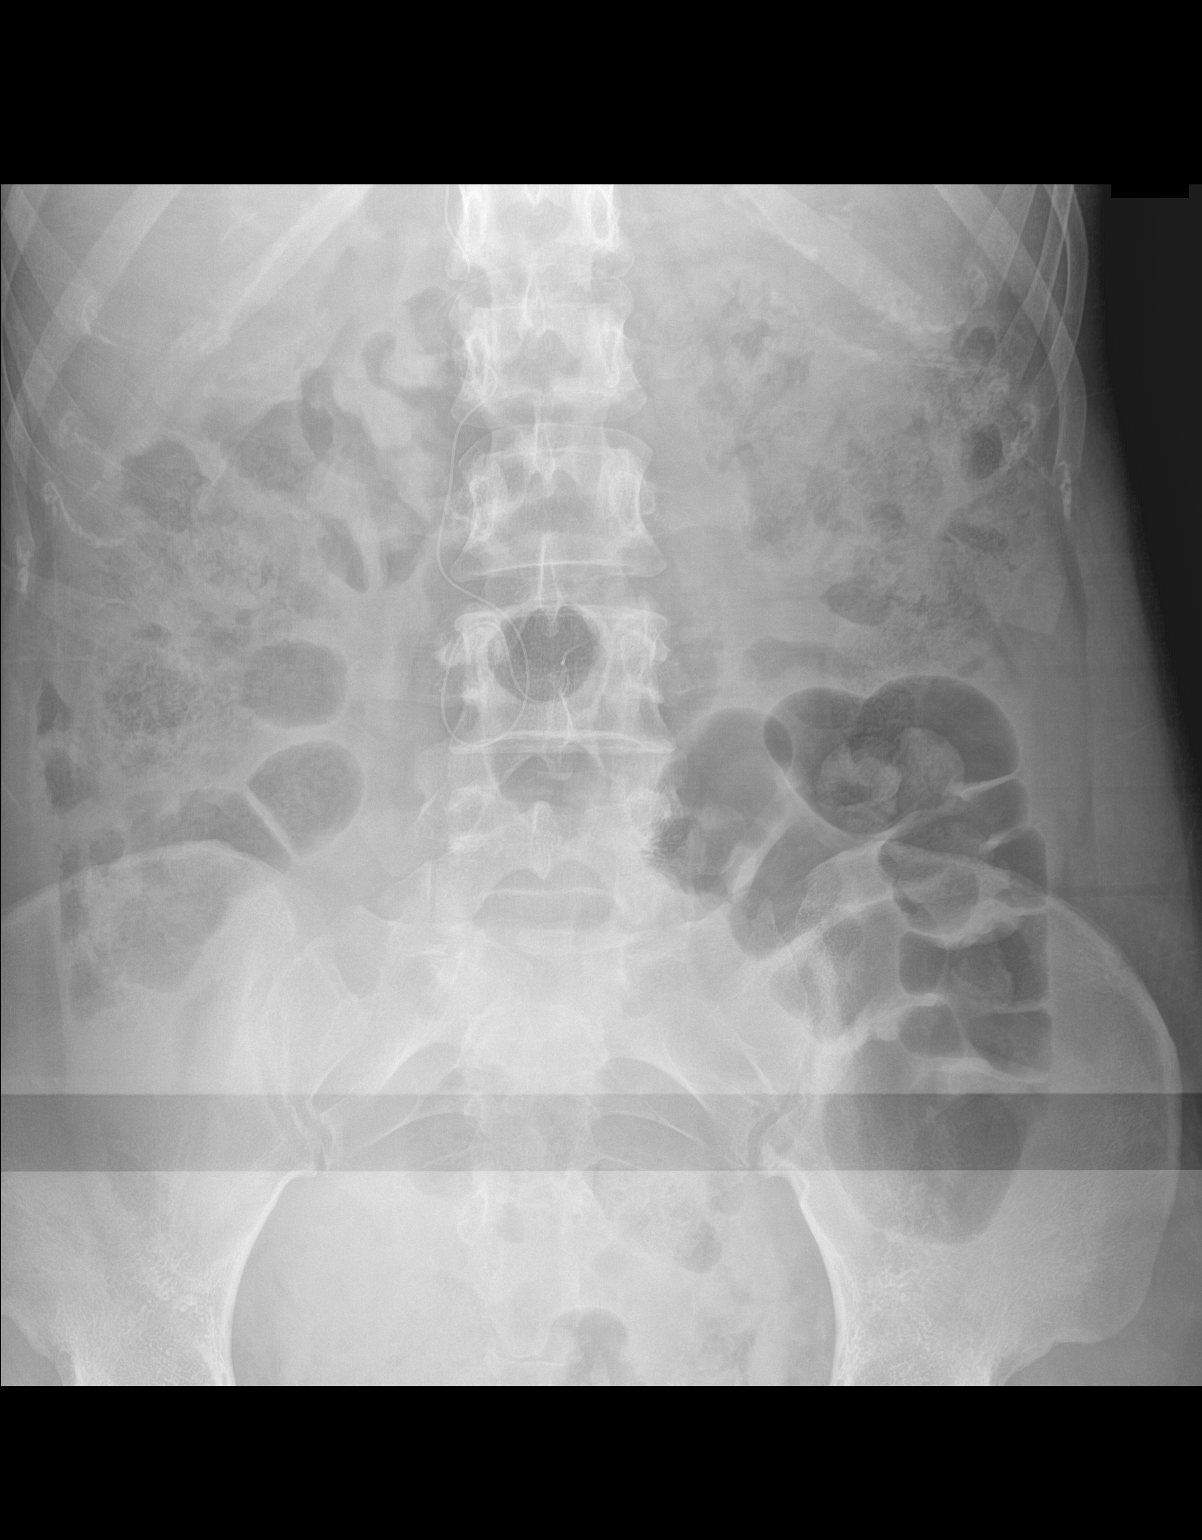
[im 2/2]
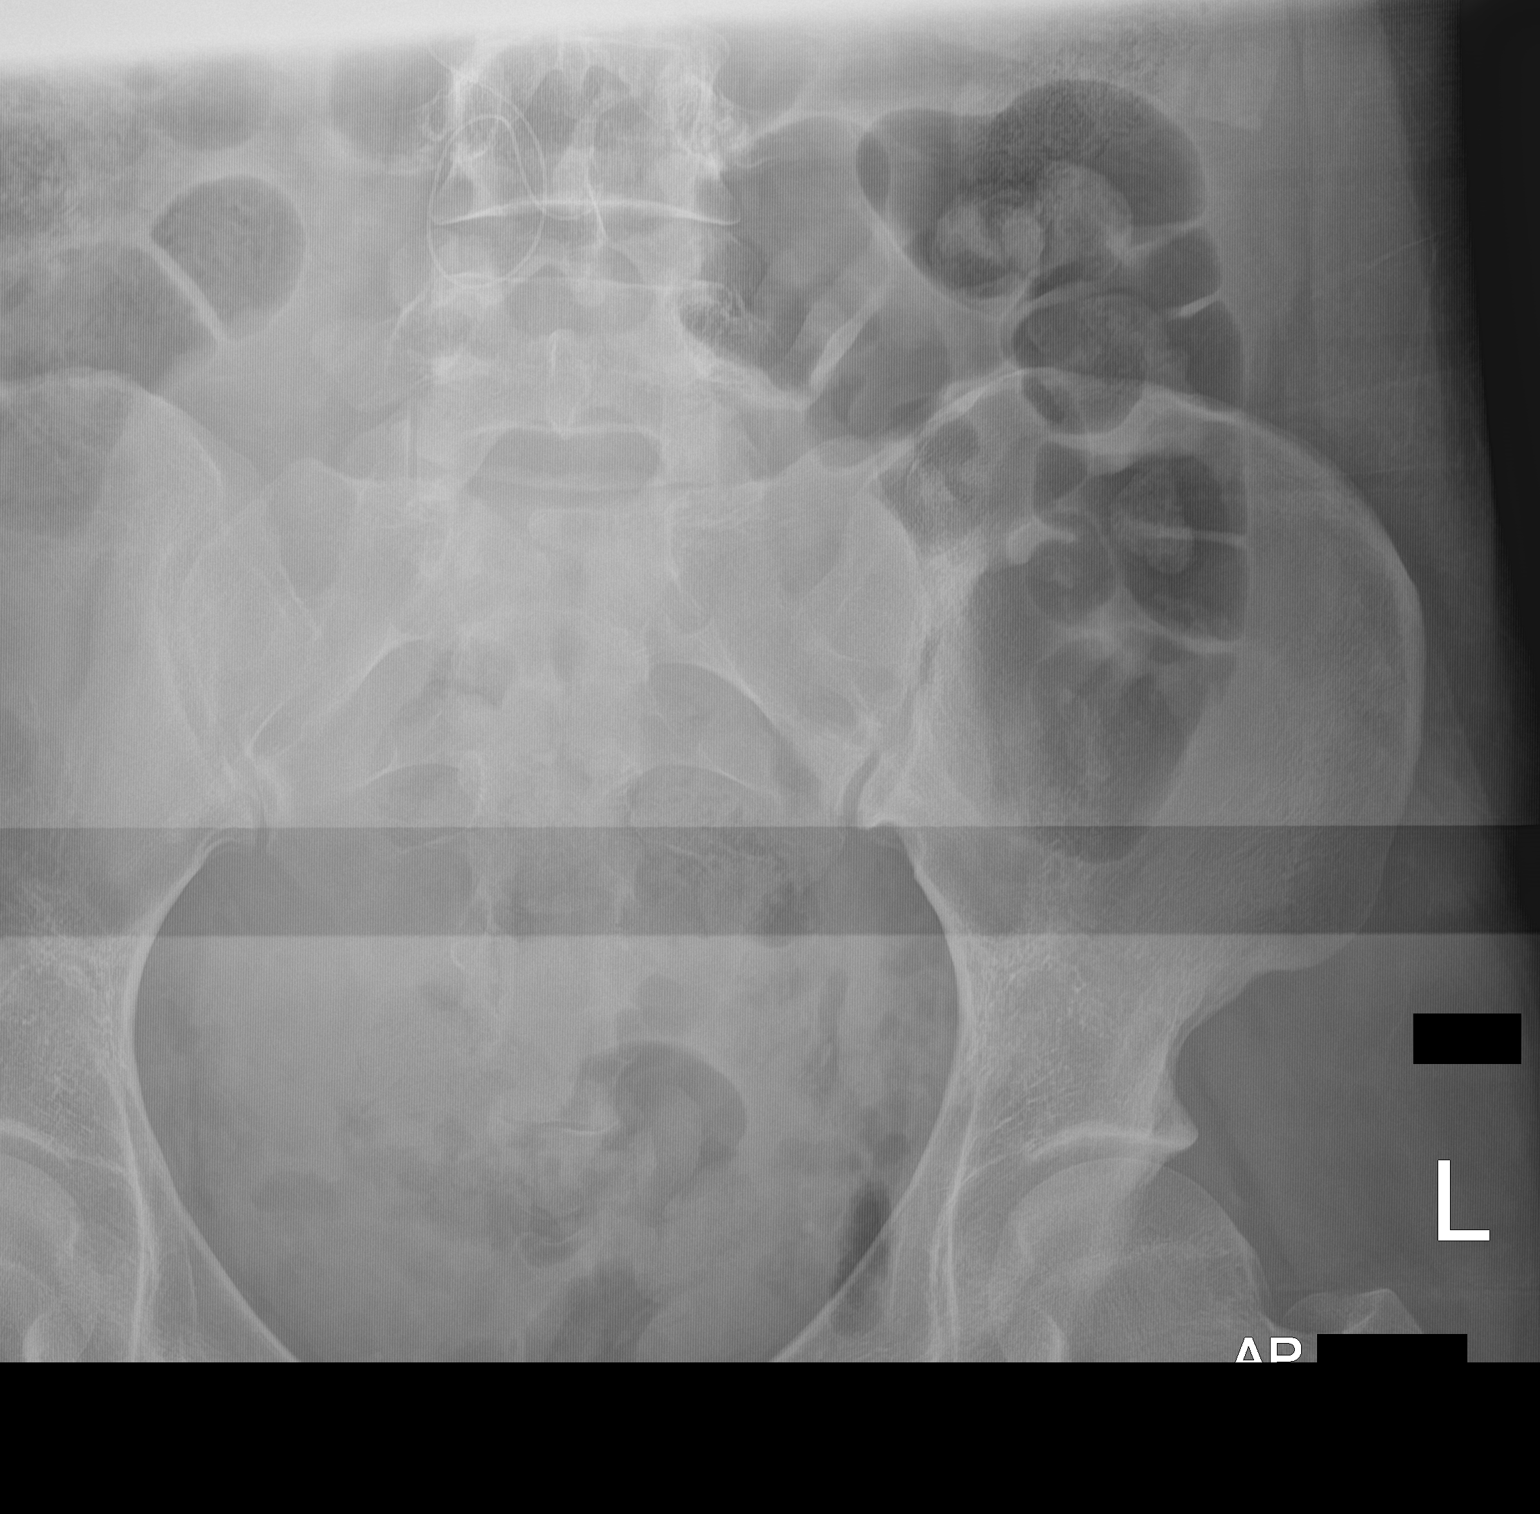

[2 of 2 positions shown; findings below may reference images not displayed]

FINDINGS: An epidural catheter is seen over the spine. No sponge marker seen
over the abdomen or pelvis. Increased soft tissue density over the
pelvis and lower abdomen correlating with history of recent gravid
uterus.
IMPRESSION: No unexpected and opaque foreign body.

## 2023-01-01 ENCOUNTER — Other Ambulatory Visit (HOSPITAL_COMMUNITY): Payer: Self-pay | Admitting: Psychiatry

## 2023-02-23 ENCOUNTER — Telehealth (INDEPENDENT_AMBULATORY_CARE_PROVIDER_SITE_OTHER): Payer: 59 | Admitting: Psychiatry

## 2023-02-23 ENCOUNTER — Encounter (HOSPITAL_COMMUNITY): Payer: Self-pay | Admitting: Psychiatry

## 2023-02-23 DIAGNOSIS — F410 Panic disorder [episodic paroxysmal anxiety] without agoraphobia: Secondary | ICD-10-CM | POA: Diagnosis not present

## 2023-02-23 DIAGNOSIS — F5102 Adjustment insomnia: Secondary | ICD-10-CM | POA: Diagnosis not present

## 2023-02-23 DIAGNOSIS — F411 Generalized anxiety disorder: Secondary | ICD-10-CM

## 2023-02-23 DIAGNOSIS — R6882 Decreased libido: Secondary | ICD-10-CM | POA: Diagnosis not present

## 2023-02-23 MED ORDER — CITALOPRAM HYDROBROMIDE 10 MG PO TABS: 10.0000 mg | ORAL_TABLET | Freq: Every day | ORAL | 0 refills | Status: AC

## 2023-02-23 NOTE — Progress Notes (Signed)
[facility] Follow up visit  Patient Identification: Tiffany Hester MRN:  [id number] Date of Evaluation:  02/23/2023 Referral Source: primary care. Dr. Mindi Junker Chief Complaint: follow up  anxiety Visit Diagnosis:    ICD-10-CM   1. GAD (generalized anxiety disorder)  F41.1     2. 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 Panic attack  F41.0      Virtual Visit via Video Note  I connected with Tiffany Hester on 02/23/23 at  3:30 PM EST by a video enabled telemedicine application and verified that I am speaking with the correct person using two identifiers.  Location: Patient: work Provider: home office   I discussed the limitations of evaluation and management by telemedicine and the availability of in person appointments. The patient expressed understanding and agreed to proceed.      I discussed the assessment and treatment plan with the patient. The patient was provided an opportunity to ask questions and all were answered. The patient agreed with the plan and demonstrated an understanding of the instructions.   The patient was advised to call back or seek an in-person evaluation if the symptoms worsen or if the condition fails to improve as anticipated.  I provided 18 minutes of non-face-to-face time during this encounter.   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 Also has another baby near 2 years Doing fair tolerating celexa, not on wellbutrin OB aware of her meds, mood and anxiety is balanced   Husband supportive, job stress manageable   Bonding is good with baby   Some job stress is there Modifying factors;husband, family Severity: manageable  Past Psychiatric History: anxiety, depression  Previous Psychotropic Medications: Yes   Substance Abuse History in the last 12 months:  No.  Consequences of Substance  Abuse: NA  Past Medical History:  Past Medical History:  Diagnosis Date   Anxiety    Asthma    Bilateral ovarian cysts    Chronic UTI (urinary tract infection)    Depression    Panic attack 12/11/2012   Pregnancy induced hypertension    Right ovarian cyst 07/26/2020    Past Surgical History:  Procedure Laterality Date   CESAREAN SECTION N/A 06/21/2021   Procedure: CESAREAN SECTION;  Surgeon: Shea Evans, MD;  Location: [facility];  Service: Obstetrics;  Laterality: N/A;   LAPAROSCOPY N/A 07/26/2020   Procedure: LAPAROSCOPY OPERATIVE;  Surgeon: Sherian Rein, MD;  Location: [facility] OR;  Service: Gynecology;  Laterality: N/A;   OVARIAN CYST REMOVAL Right 07/26/2020   Procedure: OVARIAN CYSTECTOMY;  Surgeon: Sherian Rein, MD;  Location: [facility] OR;  Service: Gynecology;  Laterality: Right;   TONSILLECTOMY       Family History:  Family History  Problem Relation Age of Onset   Hypertension Father    Crohn's disease Father    ADD / ADHD Brother    Heart attack Paternal Uncle    Alcohol abuse Paternal Uncle    Heart attack Paternal Grandfather     Social History:   Social History   Socioeconomic History   Marital status: Married    Spouse name: Not on file   Number of children: Not on file   Years of education: Not on file   Highest education level: Not on file  Occupational History   Not on file  Tobacco Use   Smoking status: Former  Current packs/day: 0.00    Types: Cigarettes    Quit date: 06/07/2019    Years since quitting: 3.7   Smokeless tobacco: Never  Vaping Use   Vaping status: Former   Substances: Nicotine  Substance and Sexual Activity   Alcohol use: Not Currently    Comment: ocassionally   Drug use: Never    Types: Marijuana    Comment: no longer using   Sexual activity: Yes    Partners: Male  Other Topics Concern   Not on file  Social History Narrative   ** Merged History Encounter **       Social Determinants of Health    Financial Resource Strain: Low Risk  (04/17/2022)   Received from [facility], [facility]   Overall Financial Resource Strain (CARDIA)    Difficulty of Paying Living Expenses: Not hard at all  Food Insecurity: No Food Insecurity (04/17/2022)   Received from [facility], [facility]   Hunger Vital Sign    Worried About Running Out of Food in the Last Year: Never true    Ran Out of Food in the Last Year: Never true  Transportation Needs: No Transportation Needs (04/17/2022)   Received from [facility], [facility]   PRAPARE - Transportation    Lack of Transportation (Medical): No    Lack of Transportation (Non-Medical): No  Physical Activity: Not on file  Stress: Not on file  Social Connections: Unknown (08/26/2021)   Received from [facility], [facility]   Social Network    Social Network: Not on file     Allergies:   Allergies  Allergen Reactions   Tape     Metabolic Disorder Labs: No results found for: "HGBA1C", "MPG" No results found for: "PROLACTIN" No results found for: "CHOL", "TRIG", "HDL", "CHOLHDL", "VLDL", "LDLCALC" No results found for: "TSH"  Therapeutic Level Labs: No results found for: "LITHIUM" No results found for: "CBMZ" No results found for: "VALPROATE"  Current Medications: Current Outpatient Medications  Medication Sig Dispense Refill   acetaminophen (TYLENOL) 325 MG tablet Take 2 tablets (650 mg total) by mouth every 4 (four) hours as needed for mild pain (temperature > 101.5.). (Patient not taking: Reported on 07/15/2022)     citalopram (CELEXA) 10 MG tablet Take 1 tablet (10 mg total) by mouth daily. 90 tablet 0   doxylamine, Sleep, (UNISOM) 25 MG tablet Take 25 mg by mouth at bedtime as needed. (Patient not taking: Reported on 07/15/2022)     famotidine (PEPCID) 10 MG tablet Take 10 mg by mouth 2 (two) times daily. (Patient not taking: Reported on 07/15/2022)     ibuprofen (ADVIL) 600 MG tablet Take 1 tablet (600 mg total) by mouth every  6 (six) hours. (Patient not taking: Reported on 07/15/2022) 30 tablet 0   oxyCODONE (OXY IR/ROXICODONE) 5 MG immediate release tablet Take 1 tablet (5 mg total) by mouth every 6 (six) hours as needed for moderate pain. (Patient not taking: Reported on 07/15/2022) 30 tablet 0   pantoprazole (PROTONIX) 20 MG tablet Take 20 mg by mouth daily. (Patient not taking: Reported on 07/15/2022)     Prenatal Vit-Fe Fumarate-FA (MULTIVITAMIN-PRENATAL) 27-0.8 MG TABS tablet Take 1 tablet by mouth daily at 12 noon.     senna-docusate (SENOKOT-S) 8.6-50 MG tablet Take 2 tablets by mouth daily. (Patient not taking: Reported on 07/15/2022)     No current facility-administered medications for this visit.      Psychiatric Specialty Exam: Review of Systems  Cardiovascular:  Negative  for chest pain.  Psychiatric/Behavioral:  Negative for agitation and behavioral problems.     currently breastfeeding.There is no height or weight on file to calculate BMI.  General Appearance: Casual  Eye Contact:  Fair  Speech:  Clear and Coherent  Volume:  Decreased  Mood: fair  Affect:  Constricted  Thought Process:  Goal Directed  Orientation:  Full (Time, Place, and Person)  Thought Content:  Logical  Suicidal Thoughts:  No  Homicidal Thoughts:  No  Memory:  Immediate;   Fair Recent;   Fair  Judgement:  Fair  Insight:  Good  Psychomotor Activity:  Normal  Concentration:  Concentration: Fair and Attention Span: Fair  Recall:  Good  Fund of Knowledge:Good  Language: Good  Akathisia:  No  Handed:   AIMS (if indicated):  not done  Assets:  Communication Skills Desire for Improvement Financial Resources/Insurance Physical Health  ADL's:  Intact  Cognition: WNL  Sleep:   variable to poor   Screenings: PHQ2-9    Flowsheet Row Video Visit from 09/19/2020 in [facility] at [facility]  PHQ-2 Total Score 0      Flowsheet Row Video Visit from 12/27/2021 in [facility] at [facility] Video Visit from 11/06/2021 in [facility] at [facility] Video Visit from 09/11/2021 in [facility] at [facility]  C-SSRS RISK CATEGORY No Risk No Risk No Risk       Assessment and Plan: as follows  Prior documentation reviewed    Panic attacks:manageable continue celexa   Generalized Anxiety disorder; fair continue celexa   Decreased sexual desire: not concerned today, wellbutrin has been stopped, she is pregnant. States not want to be on wellbutrin for now  Insomnia :   Fu 43m. States she will call herself to schedule back  Thresa Ross, MD 11/11/20243:32 PM

## 2023-04-16 LAB — OB RESULTS CONSOLE ANTIBODY SCREEN: Antibody Screen: NEGATIVE

## 2023-06-11 LAB — OB RESULTS CONSOLE GBS: GBS: NEGATIVE

## 2023-06-22 ENCOUNTER — Inpatient Hospital Stay (HOSPITAL_COMMUNITY)
Admission: AD | Admit: 2023-06-22 | Discharge: 2023-06-22 | Disposition: A | Discharge status: Home / Self Care | Attending: Obstetrics and Gynecology | Admitting: Obstetrics and Gynecology

## 2023-06-22 ENCOUNTER — Encounter (HOSPITAL_COMMUNITY): Payer: Self-pay

## 2023-06-22 DIAGNOSIS — Z3A38 38 weeks gestation of pregnancy: Secondary | ICD-10-CM

## 2023-06-22 DIAGNOSIS — O219 Vomiting of pregnancy, unspecified: Secondary | ICD-10-CM | POA: Diagnosis present

## 2023-06-22 DIAGNOSIS — O26893 Other specified pregnancy related conditions, third trimester: Secondary | ICD-10-CM | POA: Diagnosis not present

## 2023-06-22 DIAGNOSIS — K529 Noninfective gastroenteritis and colitis, unspecified: Secondary | ICD-10-CM | POA: Diagnosis not present

## 2023-06-22 DIAGNOSIS — O26899 Other specified pregnancy related conditions, unspecified trimester: Secondary | ICD-10-CM

## 2023-06-22 DIAGNOSIS — R252 Cramp and spasm: Secondary | ICD-10-CM | POA: Diagnosis not present

## 2023-06-22 DIAGNOSIS — R109 Unspecified abdominal pain: Secondary | ICD-10-CM

## 2023-06-22 DIAGNOSIS — O99613 Diseases of the digestive system complicating pregnancy, third trimester: Secondary | ICD-10-CM | POA: Diagnosis not present

## 2023-06-22 DIAGNOSIS — O471 False labor at or after 37 completed weeks of gestation: Secondary | ICD-10-CM | POA: Diagnosis present

## 2023-06-22 LAB — URINALYSIS, ROUTINE W REFLEX MICROSCOPIC
Bilirubin Urine: NEGATIVE
Glucose, UA: NEGATIVE mg/dL
Hgb urine dipstick: NEGATIVE
Ketones, ur: 20 mg/dL — AB
Nitrite: NEGATIVE
Protein, ur: NEGATIVE mg/dL
Specific Gravity, Urine: 1.024 (ref 1.005–1.030)
pH: 5 (ref 5.0–8.0)

## 2023-06-22 LAB — COMPREHENSIVE METABOLIC PANEL
ALT: 9 U/L (ref 0–44)
AST: 22 U/L (ref 15–41)
Albumin: 3.1 g/dL — ABNORMAL LOW (ref 3.5–5.0)
Alkaline Phosphatase: 171 U/L — ABNORMAL HIGH (ref 38–126)
Anion gap: 11 (ref 5–15)
BUN: 11 mg/dL (ref 6–20)
CO2: 19 mmol/L — ABNORMAL LOW (ref 22–32)
Calcium: 8.8 mg/dL — ABNORMAL LOW (ref 8.9–10.3)
Chloride: 104 mmol/L (ref 98–111)
Creatinine, Ser: 0.53 mg/dL (ref 0.44–1.00)
GFR, Estimated: >60 mL/min (ref >60)
Glucose, Bld: 105 mg/dL — ABNORMAL HIGH (ref 70–99)
Potassium: 3.6 mmol/L (ref 3.5–5.1)
Sodium: 134 mmol/L — ABNORMAL LOW (ref 135–145)
Total Bilirubin: 1 mg/dL (ref 0.0–1.2)
Total Protein: 6.4 g/dL — ABNORMAL LOW (ref 6.5–8.1)

## 2023-06-22 LAB — CBC
HCT: 42 % (ref 36.0–46.0)
Hemoglobin: 14.6 g/dL (ref 12.0–15.0)
MCH: 31.3 pg (ref 26.0–34.0)
MCHC: 34.8 g/dL (ref 30.0–36.0)
MCV: 90.1 fL (ref 80.0–100.0)
Platelets: 225 10*3/uL (ref 150–400)
RBC: 4.66 MIL/uL (ref 3.87–5.11)
RDW: 13.1 % (ref 11.5–15.5)
WBC: 15.8 10*3/uL — ABNORMAL HIGH (ref 4.0–10.5)
nRBC: 0 % (ref 0.0–0.2)

## 2023-06-22 MED ORDER — METOCLOPRAMIDE HCL 5 MG/ML IJ SOLN
10.0000 mg | Freq: Once | INTRAMUSCULAR | Status: AC
  Administered 2023-06-22: 10 mg via INTRAVENOUS
  Filled 2023-06-22: qty 2

## 2023-06-22 MED ORDER — ONDANSETRON HCL 4 MG PO TABS: 4.0000 mg | ORAL_TABLET | Freq: Three times a day (TID) | ORAL | 0 refills | Status: DC | PRN

## 2023-06-22 MED ORDER — LACTATED RINGERS IV BOLUS
1000.0000 mL | Freq: Once | INTRAVENOUS | Status: AC
  Administered 2023-06-22: 1000 mL via INTRAVENOUS

## 2023-06-22 MED ORDER — ONDANSETRON HCL 4 MG/2ML IJ SOLN
4.0000 mg | Freq: Once | INTRAMUSCULAR | Status: AC
  Administered 2023-06-22: 4 mg via INTRAVENOUS
  Filled 2023-06-22: qty 2

## 2023-06-22 NOTE — Discharge Instructions (Signed)

## 2023-06-22 NOTE — MAU Provider Note (Signed)
 Chief Complaint:  Contractions, Nausea, and Emesis   HPI   Event Date/Time   First Provider Initiated Contact with Patient 06/22/23 0316      Tiffany Hester is a 28 y.o. G2P1001 at [redacted]w[redacted]d who presents to maternity admissions reporting that she started having Nausea and ctx's with lower abdominal pain and vomiting. Denies fever, Chills, or recent sick contacts. Reports she was having CTX earlier q 7-10 minutes prior to arrival. Denies VB, LOF and reports good FM's  Pregnancy Course: [facility]  Past Medical History:  Diagnosis Date   Anxiety    Asthma    Bilateral ovarian cysts    Chronic UTI (urinary tract infection)    Depression    Panic attack 12/11/2012   Pregnancy induced hypertension    Right ovarian cyst 07/26/2020   OB History  Gravida Para Term Preterm AB Living  2 1 1   1   SAB IAB Ectopic Multiple Live Births     0 1    # Outcome Date GA Lbr Len/2nd Weight Sex Type Anes PTL Lv  2 Current           1 Term 06/21/21 [redacted]w[redacted]d  3120 g F CS-LTranv EPI  LIV   Past Surgical History:  Procedure Laterality Date   CESAREAN SECTION N/A 06/21/2021   Procedure: CESAREAN SECTION;  Surgeon: Shea Evans, MD;  [facility];  Service: Obstetrics;  Laterality: N/A;   LAPAROSCOPY N/A 07/26/2020   Procedure: LAPAROSCOPY OPERATIVE;  Surgeon: Sherian Rein, MD;  Location: [facility] OR;  Service: Gynecology;  Laterality: N/A;   OVARIAN CYST REMOVAL Right 07/26/2020   Procedure: OVARIAN CYSTECTOMY;  Surgeon: Sherian Rein, MD;  Location: [facility] OR;  Service: Gynecology;  Laterality: Right;   TONSILLECTOMY     Family History  Problem Relation Age of Onset   Hypertension Father    Crohn's disease Father    ADD / ADHD Brother    Heart attack Paternal Uncle    Alcohol abuse Paternal Uncle    Heart attack Paternal Grandfather    Social History   Tobacco Use   Smoking status: Former    Current packs/day: 0.00    Types: Cigarettes    Quit date: 06/07/2019    Years  since quitting: 4.0   Smokeless tobacco: Never  Vaping Use   Vaping status: Former   Substances: Nicotine  Substance Use Topics   Alcohol use: Not Currently    Comment: ocassionally   Drug use: Never    Types: Marijuana    Comment: no longer using   Allergies  Allergen Reactions   Tape    Medications Prior to Admission  Medication Sig Dispense Refill Last Dose/Taking   acetaminophen (TYLENOL) 325 MG tablet Take 2 tablets (650 mg total) by mouth every 4 (four) hours as needed for mild pain (temperature > 101.5.).   06/22/2023 at 12:30 AM   citalopram (CELEXA) 10 MG tablet Take 1 tablet (10 mg total) by mouth daily. 90 tablet 0 06/21/2023   famotidine (PEPCID) 10 MG tablet Take 10 mg by mouth 2 (two) times daily.   06/21/2023   Prenatal Vit-Fe Fumarate-FA (MULTIVITAMIN-PRENATAL) 27-0.8 MG TABS tablet Take 1 tablet by mouth daily at 12 noon.   06/21/2023   doxylamine, Sleep, (UNISOM) 25 MG tablet Take 25 mg by mouth at bedtime as needed. (Patient not taking: Reported on 07/15/2022)      ibuprofen (ADVIL) 600 MG tablet Take 1 tablet (600 mg total) by mouth every 6 (six) hours. (  Patient not taking: Reported on 07/15/2022) 30 tablet 0    oxyCODONE (OXY IR/ROXICODONE) 5 MG immediate release tablet Take 1 tablet (5 mg total) by mouth every 6 (six) hours as needed for moderate pain. (Patient not taking: Reported on 07/15/2022) 30 tablet 0    pantoprazole (PROTONIX) 20 MG tablet Take 20 mg by mouth daily. (Patient not taking: Reported on 07/15/2022)      senna-docusate (SENOKOT-S) 8.6-50 MG tablet Take 2 tablets by mouth daily. (Patient not taking: Reported on 07/15/2022)       I have reviewed patient's Past Medical Hx, Surgical Hx, Family Hx, Social Hx, medications and allergies.   ROS  Pertinent items noted in HPI and remainder of comprehensive ROS otherwise negative.   PHYSICAL EXAM  Patient Vitals for the past 24 hrs:  BP Temp Temp src Pulse Resp SpO2 Height Weight  06/22/23 0314 123/81 98.3 F (36.8  C) Oral (!) 105 18 99 % 5\' 10"  (1.778 m) 77.7 kg    Constitutional: Well-developed, well-nourished female in no acute distress.  Cardiovascular: normal rate & rhythm, warm and well-perfused Respiratory: normal effort, no problems with respiration noted GI: Abd soft, non-tender, gravid MS: Extremities nontender, no edema, normal ROM Neurologic: Alert and oriented x 4.  GU: no CVA tenderness Pelvic: NEFG, physiologic discharge, no blood, cervix clean.   Dilation: Fingertip Effacement (%): 50 Cervical Position: Posterior Exam by:: Tiffany Pou, RN  Fetal Tracing: @ 0400 Cat 1 reactive Baseline: 155 Variability: moderate Accelerations: present Decelerations: absent Toco: irregular   Labs: Results for orders placed or performed during the hospital encounter of 06/22/23 (from the past 24 hours)  CBC     Status: Abnormal   Collection Time: 06/22/23  3:29 AM  Result Value Ref Range   WBC 15.8 (H) 4.0 - 10.5 K/uL   RBC 4.66 3.87 - 5.11 MIL/uL   Hemoglobin 14.6 12.0 - 15.0 g/dL   HCT 78.2 95.6 - 21.3 %   MCV 90.1 80.0 - 100.0 fL   MCH 31.3 26.0 - 34.0 pg   MCHC 34.8 30.0 - 36.0 g/dL   RDW 08.6 57.8 - 46.9 %   Platelets 225 150 - 400 K/uL   nRBC 0.0 0.0 - 0.2 %  Comprehensive metabolic panel     Status: Abnormal   Collection Time: 06/22/23  3:29 AM  Result Value Ref Range   Sodium 134 (L) 135 - 145 mmol/L   Potassium 3.6 3.5 - 5.1 mmol/L   Chloride 104 98 - 111 mmol/L   CO2 19 (L) 22 - 32 mmol/L   Glucose, Bld 105 (H) 70 - 99 mg/dL   BUN 11 6 - 20 mg/dL   Creatinine, Ser 6.29 0.44 - 1.00 mg/dL   Calcium 8.8 (L) 8.9 - 10.3 mg/dL   Total Protein 6.4 (L) 6.5 - 8.1 g/dL   Albumin 3.1 (L) 3.5 - 5.0 g/dL   AST 22 15 - 41 U/L   ALT 9 0 - 44 U/L   Alkaline Phosphatase 171 (H) 38 - 126 U/L   Total Bilirubin 1.0 0.0 - 1.2 mg/dL   GFR, Estimated >52 >84 mL/min   Anion gap 11 5 - 15  Urinalysis, Routine w reflex microscopic -Urine, Random     Status: Abnormal   Collection Time:  06/22/23  4:15 AM  Result Value Ref Range   Color, Urine AMBER (A) YELLOW   APPearance HAZY (A) CLEAR   Specific Gravity, Urine 1.024 1.005 - 1.030   pH 5.0 5.0 -  8.0   Glucose, UA NEGATIVE NEGATIVE mg/dL   Hgb urine dipstick NEGATIVE NEGATIVE   Bilirubin Urine NEGATIVE NEGATIVE   Ketones, ur 20 (A) NEGATIVE mg/dL   Protein, ur NEGATIVE NEGATIVE mg/dL   Nitrite NEGATIVE NEGATIVE   Leukocytes,Ua SMALL (A) NEGATIVE   RBC / HPF 0-5 0 - 5 RBC/hpf   WBC, UA 11-20 0 - 5 WBC/hpf   Bacteria, UA MANY (A) NONE SEEN   Squamous Epithelial / HPF 21-50 0 - 5 /HPF   Mucus PRESENT     Imaging:  No results found.  MDM & MAU COURSE  MDM:  HIGH  Labs: mild Leukocytosis VSS , afebrile U/A : pending collection  IVF's- Bolus IV Zofran/ Reglan- with relief noted  PO Challenge tolerated RX 's sent  Labor check by RN  NST    I have reviewed the patient chart and performed the physical exam . I have ordered & interpreted the lab results and reviewed and interpreted the NST Medications ordered as stated below.  A/P as described below.  Counseling and education provided and patient agreeable  with plan as described below. Verbalized understanding.    MAU Course: Orders Placed This Encounter  Procedures   CBC   Comprehensive metabolic panel   Urinalysis, Routine w reflex microscopic -Urine, Random   Contraction - monitoring   External fetal heart monitoring   Vaginal exam   POCT fern test   Discharge patient Discharge disposition: 01-Home or Self Care; Discharge patient date: 06/22/2023   Meds ordered this encounter  Medications   lactated ringers bolus 1,000 mL   ondansetron (ZOFRAN) injection 4 mg   metoCLOPramide (REGLAN) injection 10 mg   ondansetron (ZOFRAN) 4 MG tablet    Sig: Take 1 tablet (4 mg total) by mouth every 8 (eight) hours as needed for nausea or vomiting.    Dispense:  20 tablet    Refill:  0    Supervising Provider:   Reva Bores [[id number]]    ASSESSMENT   1.  Nausea and vomiting during pregnancy   2. [redacted] weeks gestation of pregnancy   3. Cramping affecting pregnancy, antepartum   4. Gastroenteritis     PLAN  Discharge home in stable condition with strict return precautions.  F/U with OB   See AVS for verbal and written instructions provided patient verbalized understanding and agrees with the plan as described above   Allergies as of 06/22/2023       Reactions   Tape         Medication List     STOP taking these medications    ibuprofen 600 MG tablet Commonly known as: ADVIL   oxyCODONE 5 MG immediate release tablet Commonly known as: Oxy IR/ROXICODONE   pantoprazole 20 MG tablet Commonly known as: PROTONIX   senna-docusate 8.6-50 MG tablet Commonly known as: Senokot-S       TAKE these medications    acetaminophen 325 MG tablet Commonly known as: TYLENOL Take 2 tablets (650 mg total) by mouth every 4 (four) hours as needed for mild pain (temperature > 101.5.).   citalopram 10 MG tablet Commonly known as: CELEXA Take 1 tablet (10 mg total) by mouth daily.   doxylamine (Sleep) 25 MG tablet Commonly known as: UNISOM Take 25 mg by mouth at bedtime as needed.   famotidine 10 MG tablet Commonly known as: PEPCID Take 10 mg by mouth 2 (two) times daily.   multivitamin-prenatal 27-0.8 MG Tabs tablet Take 1 tablet by mouth  daily at 12 noon.   ondansetron 4 MG tablet Commonly known as: Zofran Take 1 tablet (4 mg total) by mouth every 8 (eight) hours as needed for nausea or vomiting.       Marcell Barlow, MSN, [facility], [facility] [employer]

## 2023-06-22 NOTE — MAU Note (Signed)
 Tiffany Hester is a 28 y.o. at [redacted]w[redacted]d here in MAU reporting: ctx that began at 2000 but got worse at 2200. Pt states the ctx were every 7-10 minutes but they have slowed down since then. Pt states she has thrown up 3x due to the pain. Pt took Tylenol around 0030 and it helped with the pain. Pt reports feeling pressure from the ctx rather than pain now. Pt states she has left lower abdomen pain that is constant that she rates 4/10. Pt denies LOF or VB. +FM   Onset of complaint: 2200 Pain score: 4/10 lower left abdomen  Pressure in abdomen from ctx  Vitals:   06/22/23 0314  BP: 123/81  Pulse: (!) 105  Resp: 18  Temp: 98.3 F (36.8 C)  SpO2: 99%     FHT:190 Lab orders placed from triage:  mau labor

## 2023-06-30 ENCOUNTER — Encounter (HOSPITAL_COMMUNITY): Payer: Self-pay

## 2023-06-30 ENCOUNTER — Other Ambulatory Visit: Payer: Self-pay | Admitting: Obstetrics & Gynecology

## 2023-06-30 NOTE — Patient Instructions (Signed)
 Tiffany Hester  06/30/2023   Your procedure is scheduled on:  3.20.2025  Arrive at 1130 at Entrance C on [address] at [facility]  and [employer]. You are invited to use the FREE valet parking or use the Visitor's parking deck.  Pick up the phone at the desk and dial [phone].  Call this number if you have problems the morning of surgery: [phone]  Remember:   Do not eat food:(After Midnight) Desps de medianoche.  You may drink clear liquids until arrival at __1130___.  Clear liquids means a liquid you can see thru.  It can have color such as Cola or Kool aid.  Tea is OK and coffee as long as no milk or creamer of any kind.  Take these medicines the morning of surgery with A SIP OF WATER:  none   Do not wear jewelry, make-up or nail polish.  Do not wear lotions, powders, or perfumes. Do not wear deodorant.  Do not shave 48 hours prior to surgery.  Do not bring valuables to the hospital.  [facility] is not   responsible for any belongings or valuables brought to the hospital.  Contacts, dentures or bridgework may not be worn into surgery.  Leave suitcase in the car. After surgery it may be brought to your room.  For patients admitted to the hospital, checkout time is 11:00 AM the day of              discharge.      Please read over the following fact sheets that you were given:     Preparing for Surgery

## 2023-07-01 ENCOUNTER — Encounter (HOSPITAL_COMMUNITY)
Admission: RE | Admit: 2023-07-01 | Discharge: 2023-07-01 | Disposition: A | Discharge status: Home / Self Care | Source: Ambulatory Visit | Attending: Obstetrics & Gynecology | Admitting: Obstetrics & Gynecology

## 2023-07-01 DIAGNOSIS — Z01812 Encounter for preprocedural laboratory examination: Secondary | ICD-10-CM | POA: Insufficient documentation

## 2023-07-01 LAB — TYPE AND SCREEN
ABO/RH(D): O NEG
Antibody Screen: NEGATIVE

## 2023-07-01 LAB — RPR: RPR Ser Ql: NONREACTIVE

## 2023-07-01 LAB — CBC
HCT: 39.7 % (ref 36.0–46.0)
Hemoglobin: 14 g/dL (ref 12.0–15.0)
MCH: 31.3 pg (ref 26.0–34.0)
MCHC: 35.3 g/dL (ref 30.0–36.0)
MCV: 88.6 fL (ref 80.0–100.0)
Platelets: 257 10*3/uL (ref 150–400)
RBC: 4.48 MIL/uL (ref 3.87–5.11)
RDW: 13 % (ref 11.5–15.5)
WBC: 10.3 10*3/uL (ref 4.0–10.5)
nRBC: 0 % (ref 0.0–0.2)

## 2023-07-02 ENCOUNTER — Encounter (HOSPITAL_COMMUNITY): Payer: Self-pay | Admitting: Obstetrics

## 2023-07-02 ENCOUNTER — Inpatient Hospital Stay (HOSPITAL_COMMUNITY): Admitting: Anesthesiology

## 2023-07-02 ENCOUNTER — Encounter (HOSPITAL_COMMUNITY)
Admission: AD | Disposition: A | Discharge status: Home / Self Care | Payer: Self-pay | Source: Home / Self Care | Attending: Obstetrics & Gynecology

## 2023-07-02 ENCOUNTER — Inpatient Hospital Stay (HOSPITAL_COMMUNITY): Admission: RE | Admit: 2023-07-02 | Source: Home / Self Care | Admitting: Obstetrics & Gynecology

## 2023-07-02 ENCOUNTER — Inpatient Hospital Stay (HOSPITAL_COMMUNITY)
Admission: AD | Admit: 2023-07-02 | Discharge: 2023-07-04 | DRG: 788 | Disposition: A | Discharge status: Home / Self Care | Attending: Obstetrics & Gynecology | Admitting: Obstetrics & Gynecology

## 2023-07-02 DIAGNOSIS — Z6791 Unspecified blood type, Rh negative: Secondary | ICD-10-CM | POA: Diagnosis not present

## 2023-07-02 DIAGNOSIS — O99344 Other mental disorders complicating childbirth: Secondary | ICD-10-CM | POA: Diagnosis present

## 2023-07-02 DIAGNOSIS — Z3A39 39 weeks gestation of pregnancy: Secondary | ICD-10-CM

## 2023-07-02 DIAGNOSIS — O26893 Other specified pregnancy related conditions, third trimester: Secondary | ICD-10-CM | POA: Diagnosis present

## 2023-07-02 DIAGNOSIS — O9962 Diseases of the digestive system complicating childbirth: Secondary | ICD-10-CM | POA: Diagnosis present

## 2023-07-02 DIAGNOSIS — F419 Anxiety disorder, unspecified: Secondary | ICD-10-CM | POA: Diagnosis present

## 2023-07-02 DIAGNOSIS — O43193 Other malformation of placenta, third trimester: Secondary | ICD-10-CM | POA: Diagnosis present

## 2023-07-02 DIAGNOSIS — K219 Gastro-esophageal reflux disease without esophagitis: Secondary | ICD-10-CM | POA: Diagnosis present

## 2023-07-02 DIAGNOSIS — O34211 Maternal care for low transverse scar from previous cesarean delivery: Secondary | ICD-10-CM

## 2023-07-02 DIAGNOSIS — Z98891 History of uterine scar from previous surgery: Principal | ICD-10-CM

## 2023-07-02 DIAGNOSIS — Z87891 Personal history of nicotine dependence: Secondary | ICD-10-CM | POA: Diagnosis not present

## 2023-07-02 DIAGNOSIS — Z8249 Family history of ischemic heart disease and other diseases of the circulatory system: Secondary | ICD-10-CM

## 2023-07-02 DIAGNOSIS — O99892 Other specified diseases and conditions complicating childbirth: Secondary | ICD-10-CM

## 2023-07-02 SURGERY — Surgical Case
Anesthesia: Spinal

## 2023-07-02 MED ORDER — DIPHENHYDRAMINE HCL 50 MG/ML IJ SOLN: 12.5000 mg | INTRAMUSCULAR | Status: DC | PRN

## 2023-07-02 MED ORDER — CEFAZOLIN SODIUM-DEXTROSE 2-4 GM/100ML-% IV SOLN
2.0000 g | INTRAVENOUS | Status: AC
  Administered 2023-07-02: 2 g via INTRAVENOUS
  Filled 2023-07-02: qty 100

## 2023-07-02 MED ORDER — KETOROLAC TROMETHAMINE 30 MG/ML IJ SOLN: 30.0000 mg | Freq: Four times a day (QID) | INTRAMUSCULAR | Status: DC | PRN

## 2023-07-02 MED ORDER — MORPHINE SULFATE (PF) 0.5 MG/ML IJ SOLN
INTRAMUSCULAR | Status: DC | PRN
  Administered 2023-07-02: 150 ug via INTRATHECAL

## 2023-07-02 MED ORDER — FAMOTIDINE 20 MG PO TABS
20.0000 mg | ORAL_TABLET | Freq: Every day | ORAL | Status: DC
  Administered 2023-07-03: 20 mg via ORAL
  Filled 2023-07-02: qty 1

## 2023-07-02 MED ORDER — SOD CITRATE-CITRIC ACID 500-334 MG/5ML PO SOLN
30.0000 mL | Freq: Once | ORAL | Status: AC
  Administered 2023-07-02: 30 mL via ORAL
  Filled 2023-07-02: qty 30

## 2023-07-02 MED ORDER — ACETAMINOPHEN 160 MG/5ML PO SOLN: 960.0000 mg | Freq: Once | ORAL | Status: DC

## 2023-07-02 MED ORDER — PRENATAL MULTIVITAMIN CH
1.0000 | ORAL_TABLET | Freq: Every day | ORAL | Status: DC
  Administered 2023-07-03 – 2023-07-04 (×2): 1 via ORAL
  Filled 2023-07-02 (×2): qty 1

## 2023-07-02 MED ORDER — SODIUM CHLORIDE 0.9 % IR SOLN
Status: DC | PRN
  Administered 2023-07-02: 1

## 2023-07-02 MED ORDER — PHENYLEPHRINE HCL-NACL 20-0.9 MG/250ML-% IV SOLN
INTRAVENOUS | Status: DC | PRN
  Administered 2023-07-02: 60 ug/min via INTRAVENOUS

## 2023-07-02 MED ORDER — NALOXONE HCL 4 MG/10ML IJ SOLN: 1.0000 ug/kg/h | INTRAVENOUS | Status: DC | PRN

## 2023-07-02 MED ORDER — MORPHINE SULFATE (PF) 0.5 MG/ML IJ SOLN
INTRAMUSCULAR | Status: AC
  Filled 2023-07-02: qty 10

## 2023-07-02 MED ORDER — ONDANSETRON HCL 4 MG/2ML IJ SOLN
INTRAMUSCULAR | Status: AC
  Filled 2023-07-02: qty 2

## 2023-07-02 MED ORDER — MENTHOL 3 MG MT LOZG: 1.0000 | LOZENGE | OROMUCOSAL | Status: DC | PRN

## 2023-07-02 MED ORDER — AMISULPRIDE (ANTIEMETIC) 5 MG/2ML IV SOLN: 10.0000 mg | Freq: Once | INTRAVENOUS | Status: DC | PRN

## 2023-07-02 MED ORDER — ONDANSETRON HCL 4 MG/2ML IJ SOLN
INTRAMUSCULAR | Status: DC | PRN
  Administered 2023-07-02: 4 mg via INTRAVENOUS

## 2023-07-02 MED ORDER — COCONUT OIL OIL: 1.0000 | TOPICAL_OIL | Status: DC | PRN

## 2023-07-02 MED ORDER — LACTATED RINGERS IV SOLN
INTRAVENOUS | Status: DC
Start: 2023-07-02 — End: 2023-07-02

## 2023-07-02 MED ORDER — OXYTOCIN-SODIUM CHLORIDE 30-0.9 UT/500ML-% IV SOLN
INTRAVENOUS | Status: DC | PRN
  Administered 2023-07-02: 300 mL via INTRAVENOUS

## 2023-07-02 MED ORDER — ACETAMINOPHEN 500 MG PO TABS: 1000.0000 mg | ORAL_TABLET | Freq: Once | ORAL | Status: DC

## 2023-07-02 MED ORDER — ZOLPIDEM TARTRATE 5 MG PO TABS: 5.0000 mg | ORAL_TABLET | Freq: Every evening | ORAL | Status: DC | PRN

## 2023-07-02 MED ORDER — OXYCODONE HCL 5 MG PO TABS
5.0000 mg | ORAL_TABLET | ORAL | Status: DC | PRN
  Administered 2023-07-03 – 2023-07-04 (×2): 5 mg via ORAL
  Filled 2023-07-02 (×2): qty 1

## 2023-07-02 MED ORDER — SCOPOLAMINE 1 MG/3DAYS TD PT72
1.0000 | MEDICATED_PATCH | Freq: Once | TRANSDERMAL | Status: DC
  Administered 2023-07-02: 1.5 mg via TRANSDERMAL
  Filled 2023-07-02: qty 1

## 2023-07-02 MED ORDER — KETOROLAC TROMETHAMINE 30 MG/ML IJ SOLN
30.0000 mg | Freq: Four times a day (QID) | INTRAMUSCULAR | Status: AC
  Administered 2023-07-02 – 2023-07-03 (×3): 30 mg via INTRAVENOUS
  Filled 2023-07-02 (×3): qty 1

## 2023-07-02 MED ORDER — PRENATAL 27-0.8 MG PO TABS: 1.0000 | ORAL_TABLET | Freq: Every day | ORAL | Status: DC

## 2023-07-02 MED ORDER — BUPIVACAINE IN DEXTROSE 0.75-8.25 % IT SOLN
INTRATHECAL | Status: DC | PRN
  Administered 2023-07-02: 1.6 mL via INTRATHECAL

## 2023-07-02 MED ORDER — POVIDONE-IODINE 10 % EX SWAB
2.0000 | Freq: Once | CUTANEOUS | Status: AC
  Administered 2023-07-02: 2 via TOPICAL

## 2023-07-02 MED ORDER — ONDANSETRON HCL 4 MG/2ML IJ SOLN: 4.0000 mg | Freq: Three times a day (TID) | INTRAMUSCULAR | Status: DC | PRN

## 2023-07-02 MED ORDER — SENNOSIDES-DOCUSATE SODIUM 8.6-50 MG PO TABS
2.0000 | ORAL_TABLET | Freq: Every day | ORAL | Status: DC
  Administered 2023-07-03 – 2023-07-04 (×2): 2 via ORAL
  Filled 2023-07-02 (×2): qty 2

## 2023-07-02 MED ORDER — OXYCODONE HCL 5 MG PO TABS: 5.0000 mg | ORAL_TABLET | Freq: Once | ORAL | Status: DC | PRN

## 2023-07-02 MED ORDER — FENTANYL CITRATE (PF) 100 MCG/2ML IJ SOLN: 25.0000 ug | INTRAMUSCULAR | Status: DC | PRN

## 2023-07-02 MED ORDER — DEXAMETHASONE SODIUM PHOSPHATE 10 MG/ML IJ SOLN
INTRAMUSCULAR | Status: AC
  Filled 2023-07-02: qty 1

## 2023-07-02 MED ORDER — CHLORHEXIDINE GLUCONATE 0.12 % MT SOLN
15.0000 mL | Freq: Once | OROMUCOSAL | Status: DC
  Filled 2023-07-02: qty 15

## 2023-07-02 MED ORDER — BUPIVACAINE HCL (PF) 0.5 % IJ SOLN
INTRAMUSCULAR | Status: AC
  Filled 2023-07-02: qty 30

## 2023-07-02 MED ORDER — ACETAMINOPHEN 10 MG/ML IV SOLN
INTRAVENOUS | Status: DC | PRN
  Administered 2023-07-02: 1000 mg via INTRAVENOUS

## 2023-07-02 MED ORDER — SODIUM CHLORIDE 0.9 % IV SOLN: 12.5000 mg | INTRAVENOUS | Status: DC | PRN

## 2023-07-02 MED ORDER — ORAL CARE MOUTH RINSE: 15.0000 mL | Freq: Once | OROMUCOSAL | Status: DC

## 2023-07-02 MED ORDER — NALOXONE HCL 0.4 MG/ML IJ SOLN: 0.4000 mg | INTRAMUSCULAR | Status: DC | PRN

## 2023-07-02 MED ORDER — FAMOTIDINE 20 MG PO TABS
20.0000 mg | ORAL_TABLET | Freq: Once | ORAL | Status: AC
  Administered 2023-07-02: 20 mg via ORAL
  Filled 2023-07-02: qty 1

## 2023-07-02 MED ORDER — DIPHENHYDRAMINE HCL 25 MG PO CAPS: 25.0000 mg | ORAL_CAPSULE | Freq: Four times a day (QID) | ORAL | Status: DC | PRN

## 2023-07-02 MED ORDER — ACETAMINOPHEN 325 MG PO TABS
650.0000 mg | ORAL_TABLET | ORAL | Status: DC | PRN
  Administered 2023-07-03 – 2023-07-04 (×3): 650 mg via ORAL
  Filled 2023-07-02 (×3): qty 2

## 2023-07-02 MED ORDER — KETOROLAC TROMETHAMINE 30 MG/ML IJ SOLN
INTRAMUSCULAR | Status: AC
Start: 2023-07-02 — End: ?
  Filled 2023-07-02: qty 1

## 2023-07-02 MED ORDER — DIPHENHYDRAMINE HCL 25 MG PO CAPS
25.0000 mg | ORAL_CAPSULE | ORAL | Status: DC | PRN
  Administered 2023-07-02 (×2): 25 mg via ORAL
  Filled 2023-07-02 (×2): qty 1

## 2023-07-02 MED ORDER — EPHEDRINE SULFATE-NACL 50-0.9 MG/10ML-% IV SOSY
PREFILLED_SYRINGE | INTRAVENOUS | Status: DC | PRN
  Administered 2023-07-02: 10 mg via INTRAVENOUS

## 2023-07-02 MED ORDER — SODIUM CHLORIDE 0.9% FLUSH: 3.0000 mL | INTRAVENOUS | Status: DC | PRN

## 2023-07-02 MED ORDER — DIBUCAINE (PERIANAL) 1 % EX OINT: 1.0000 | TOPICAL_OINTMENT | CUTANEOUS | Status: DC | PRN

## 2023-07-02 MED ORDER — BUPIVACAINE HCL (PF) 0.5 % IJ SOLN
INTRAMUSCULAR | Status: DC | PRN
  Administered 2023-07-02: 30 mL

## 2023-07-02 MED ORDER — FENTANYL CITRATE (PF) 100 MCG/2ML IJ SOLN
INTRAMUSCULAR | Status: DC | PRN
  Administered 2023-07-02: 15 ug via INTRATHECAL

## 2023-07-02 MED ORDER — SCOPOLAMINE 1 MG/3DAYS TD PT72: 1.0000 | MEDICATED_PATCH | Freq: Once | TRANSDERMAL | Status: DC

## 2023-07-02 MED ORDER — KETOROLAC TROMETHAMINE 30 MG/ML IJ SOLN
30.0000 mg | Freq: Four times a day (QID) | INTRAMUSCULAR | Status: DC | PRN
  Administered 2023-07-02: 30 mg via INTRAVENOUS

## 2023-07-02 MED ORDER — OXYCODONE HCL 5 MG/5ML PO SOLN: 5.0000 mg | Freq: Once | ORAL | Status: DC | PRN

## 2023-07-02 MED ORDER — IBUPROFEN 600 MG PO TABS
600.0000 mg | ORAL_TABLET | Freq: Four times a day (QID) | ORAL | Status: DC
  Administered 2023-07-03 – 2023-07-04 (×4): 600 mg via ORAL
  Filled 2023-07-02 (×4): qty 1

## 2023-07-02 MED ORDER — LACTATED RINGERS IV SOLN: INTRAVENOUS | Status: DC

## 2023-07-02 MED ORDER — DEXAMETHASONE SODIUM PHOSPHATE 10 MG/ML IJ SOLN
INTRAMUSCULAR | Status: DC | PRN
  Administered 2023-07-02 (×2): 5 mg via INTRAVENOUS

## 2023-07-02 MED ORDER — MEPERIDINE HCL 25 MG/ML IJ SOLN: 6.2500 mg | INTRAMUSCULAR | Status: DC | PRN

## 2023-07-02 MED ORDER — OXYTOCIN-SODIUM CHLORIDE 30-0.9 UT/500ML-% IV SOLN: 2.5000 [IU]/h | INTRAVENOUS | Status: AC

## 2023-07-02 MED ORDER — TRANEXAMIC ACID-NACL 1000-0.7 MG/100ML-% IV SOLN
INTRAVENOUS | Status: DC | PRN
  Administered 2023-07-02: 1000 mg via INTRAVENOUS

## 2023-07-02 MED ORDER — STERILE WATER FOR IRRIGATION IR SOLN
Status: DC | PRN
  Administered 2023-07-02: 1000 mL

## 2023-07-02 MED ORDER — SIMETHICONE 80 MG PO CHEW
80.0000 mg | CHEWABLE_TABLET | Freq: Three times a day (TID) | ORAL | Status: DC
  Administered 2023-07-03 – 2023-07-04 (×4): 80 mg via ORAL
  Filled 2023-07-02 (×4): qty 1

## 2023-07-02 MED ORDER — WITCH HAZEL-GLYCERIN EX PADS: 1.0000 | MEDICATED_PAD | CUTANEOUS | Status: DC | PRN

## 2023-07-02 MED ORDER — SIMETHICONE 80 MG PO CHEW: 80.0000 mg | CHEWABLE_TABLET | ORAL | Status: DC | PRN

## 2023-07-02 MED ORDER — FENTANYL CITRATE (PF) 100 MCG/2ML IJ SOLN
INTRAMUSCULAR | Status: AC
  Filled 2023-07-02: qty 2

## 2023-07-02 SURGICAL SUPPLY — 33 items
BENZOIN TINCTURE PRP APPL 2/3 (GAUZE/BANDAGES/DRESSINGS) IMPLANT
CHLORAPREP W/TINT 26 (MISCELLANEOUS) ×2 IMPLANT
CLAMP UMBILICAL CORD (MISCELLANEOUS) ×1 IMPLANT
CLOTH BEACON ORANGE TIMEOUT ST (SAFETY) ×1 IMPLANT
DRSG OPSITE POSTOP 4X10 (GAUZE/BANDAGES/DRESSINGS) ×1 IMPLANT
ELECT REM PT RETURN 9FT ADLT (ELECTROSURGICAL) ×1 IMPLANT
ELECTRODE REM PT RTRN 9FT ADLT (ELECTROSURGICAL) ×1 IMPLANT
EXTRACTOR VACUUM KIWI (MISCELLANEOUS) IMPLANT
EXTRACTOR VACUUM M CUP 4 TUBE (SUCTIONS) IMPLANT
GLOVE BIO SURGEON STRL SZ7 (GLOVE) ×1 IMPLANT
GLOVE BIOGEL PI IND STRL 7.0 (GLOVE) ×1 IMPLANT
GOWN STRL REUS W/TWL LRG LVL3 (GOWN DISPOSABLE) ×2 IMPLANT
KIT ABG SYR 3ML LUER SLIP (SYRINGE) IMPLANT
MAT PREVALON FULL STRYKER (MISCELLANEOUS) IMPLANT
NDL HYPO 25X5/8 SAFETYGLIDE (NEEDLE) IMPLANT
NEEDLE HYPO 25X5/8 SAFETYGLIDE (NEEDLE) IMPLANT
NS IRRIG 1000ML POUR BTL (IV SOLUTION) ×1 IMPLANT
PACK C SECTION WH (CUSTOM PROCEDURE TRAY) ×1 IMPLANT
PAD OB MATERNITY 4.3X12.25 (PERSONAL CARE ITEMS) ×1 IMPLANT
RTRCTR C-SECT PINK 25CM LRG (MISCELLANEOUS) IMPLANT
STRIP CLOSURE SKIN 1/2X4 (GAUZE/BANDAGES/DRESSINGS) IMPLANT
SUT MNCRL 0 VIOLET CTX 36 (SUTURE) ×2 IMPLANT
SUT MON AB 4-0 PS1 27 (SUTURE) IMPLANT
SUT PLAIN 0 NONE (SUTURE) IMPLANT
SUT PLAIN ABS 2-0 CT1 27XMFL (SUTURE) IMPLANT
SUT VIC AB 0 CT1 27XBRD ANBCTR (SUTURE) ×2 IMPLANT
SUT VIC AB 2-0 CT1 TAPERPNT 27 (SUTURE) ×1 IMPLANT
SUT VIC AB 2-0 SH 27XBRD (SUTURE) ×2 IMPLANT
SUT VIC AB 4-0 KS 27 (SUTURE) ×1 IMPLANT
SUT VICRYL 0 TIES 12 18 (SUTURE) IMPLANT
TOWEL OR 17X24 6PK STRL BLUE (TOWEL DISPOSABLE) ×1 IMPLANT
TRAY FOLEY W/BAG SLVR 14FR LF (SET/KITS/TRAYS/PACK) IMPLANT
WATER STERILE IRR 1000ML POUR (IV SOLUTION) ×1 IMPLANT

## 2023-07-02 NOTE — H&P (Signed)
 Tiffany Hester is a 28 y.o. female presenting for repeat elective C/section, changed her mind about TOLAC. Her G1- emerg C/s for cord prolapse .Does not want another emergency.Tiffany Hester is doing well  +FMs off and on UCs no vb or LOF   Rh neg Marginal cord - AGA baby w/ 3rd trim sono  32 wk - .EFW  4'8" 47% AC 44% AFI 12 cm Vx, anterior placenta Anxiety - Celexa 10 mg    OB History     Gravida  2   Para  1   Term  1   Preterm      AB      Living  1      SAB      IAB      Ectopic      Multiple  0   Live Births  1          Past Medical History:  Diagnosis Date   Anxiety    Asthma    Bilateral ovarian cysts    Chronic UTI (urinary tract infection)    Depression    Panic attack 12/11/2012   Pregnancy induced hypertension    Right ovarian cyst 07/26/2020   Past Surgical History:  Procedure Laterality Date   CESAREAN SECTION N/A 06/21/2021   Procedure: CESAREAN SECTION;  Surgeon: Tiffany Evans, MD;  Location: [facility];  Service: Obstetrics;  Laterality: N/A;   LAPAROSCOPY N/A 07/26/2020   Procedure: LAPAROSCOPY OPERATIVE;  Surgeon: Tiffany Rein, MD;  Location: [facility] OR;  Service: Gynecology;  Laterality: N/A;   OVARIAN CYST REMOVAL Right 07/26/2020   Procedure: OVARIAN CYSTECTOMY;  Surgeon: Tiffany Rein, MD;  Location: [facility] OR;  Service: Gynecology;  Laterality: Right;   TONSILLECTOMY     Family History: family history includes ADD / ADHD in her brother; Alcohol abuse in her paternal uncle; Breast cancer in her mother; Crohn's disease in her father; Heart attack in her paternal grandfather and paternal uncle; Hypertension in her father. Social History:  reports that she quit smoking about 4 years ago. Her smoking use included cigarettes. She has never used smokeless tobacco. She reports that she does not currently use alcohol. She reports that she does not use drugs.     Maternal Diabetes: No Genetic Screening: Normal Maternal  Ultrasounds/Referrals: Normal Fetal Ultrasounds or other Referrals:  None Maternal Substance Abuse:  No Significant Maternal Medications:  Meds include: Other: Celexa 10 mg  Significant Maternal Lab Results:  Rh negative Number of Prenatal Visits:greater than 3 verified prenatal visits Maternal Vaccinations:TDap Other Comments:  None  Exam There were no vitals taken for this visit.  A&O x 3, no acute distress. Pleasant HEENT neg, no thyromegaly Lungs CTA bilat CV RRR, S1S2 normal Abdo soft, non tender, non acute Extr no edema/ tenderness Pelvic  1-2/50%/-2 Vx  FHT  120s reactive  Toco rare   Prenatal labs: ABO, Rh: --/--/O NEG (03/19 1040) Antibody: NEG (03/19 1040) Rubella: Immune (08/26 0000) RPR: NON REACTIVE (03/19 1035)  HBsAg: Negative (08/26 0000)  Hep C neg HIV: Non-reactive (08/26 0000)  GBS: Negative/-- (02/27 0000)  Glucola nl  NIPT low risk AFP1 neg   Assessment/Plan: 28 yo G2P1001  39.5 weeks, here for elective repeat cesarean section  Risks/complications of surgery reviewed incl infection, bleeding, damage to internal organs including bladder, bowels, ureters, blood vessels, other risks from anesthesia, VTE and delayed complications of any surgery, complications in future surgery reviewed. Also discussed neonatal complications incl difficult delivery, laceration, vacuum assistance,  TTN etc. Pt understands and agrees, all concerns addressed.      Tiffany Hester 07/02/2023, 1.30 pm

## 2023-07-02 NOTE — H&P (Addendum)
 OB ADMISSION/ HISTORY & PHYSICAL:  Admission Date: 07/02/2023  9:04 AM  Admit Diagnosis: ctx    Tiffany Hester is a 28 y.o. female G2P1001 at [redacted]w[redacted]d with history of prior C/S x1 presenting for contractions since early this morning, now coming every 5-6 minutes. Patient reports bloody mucous discharge, but no continuing bright red vaginal bleeding. Denies leakage of fluid or decreased fetal movement.   Prenatal History: G2P1001   EDC: 07/04/2023, by Other Basis  Prenatal care at [facility] since 10w  Primary: Dr. Juliene Pina  Prenatal course complicated by: H/o LTCS x1 for cord prolapse- has been back and forth on TOLAC vs eRCS; scheduled for RCS today at 1p Rh negative s/p Rhogam @ 28w Marginal cord insertion Recurrent UTI  Anxiety on Celexa 10mg  daily   Prenatal Labs: ABO, Rh:    Antibody: NEG (03/19 1040) Rubella: Immune (08/26 0000)  RPR: NON REACTIVE (03/19 1035)  HBsAg: Negative (08/26 0000)  HIV: Non-reactive (08/26 0000)  GBS: Negative/-- (02/27 0000)  1 hr Glucola : 139, 3h wnl Genetic Screening: LR XY NIPT Ultrasound: 05/13/23  4'8" 47%  AC 44% AFI 12 cm  Vx,  anterior placenta.  MCI.    Maternal Diabetes: No Genetic Screening: Normal Maternal Ultrasounds/Referrals: Normal Fetal Ultrasounds or other Referrals:  None Maternal Substance Abuse:  No Significant Maternal Medications:  None Significant Maternal Lab Results:  Group B Strep negative Other Comments:  None  Medical / Surgical History : Past medical history:  Past Medical History:  Diagnosis Date   Anxiety    Asthma    Bilateral ovarian cysts    Chronic UTI (urinary tract infection)    Depression    Panic attack 12/11/2012   Pregnancy induced hypertension    Right ovarian cyst 07/26/2020    Past surgical history:  Past Surgical History:  Procedure Laterality Date   CESAREAN SECTION N/A 06/21/2021   Procedure: CESAREAN SECTION;  Surgeon: Shea Evans, MD;  Location: [facility];  Service:  Obstetrics;  Laterality: N/A;   LAPAROSCOPY N/A 07/26/2020   Procedure: LAPAROSCOPY OPERATIVE;  Surgeon: Sherian Rein, MD;  Location: [facility] OR;  Service: Gynecology;  Laterality: N/A;   OVARIAN CYST REMOVAL Right 07/26/2020   Procedure: OVARIAN CYSTECTOMY;  Surgeon: Sherian Rein, MD;  Location: [facility] OR;  Service: Gynecology;  Laterality: Right;   TONSILLECTOMY      Family History:  Family History  Problem Relation Age of Onset   Breast cancer Mother    Hypertension Father    Crohn's disease Father    ADD / ADHD Brother    Heart attack Paternal Uncle    Alcohol abuse Paternal Uncle    Heart attack Paternal Grandfather     Social History:  reports that she quit smoking about 4 years ago. Her smoking use included cigarettes. She has never used smokeless tobacco. She reports that she does not currently use alcohol. She reports that she does not currently use drugs after having used the following drugs: Marijuana.  Allergies: Other and Tape   Current Medications at time of admission:  Medications Prior to Admission  Medication Sig Dispense Refill Last Dose/Taking   citalopram (CELEXA) 10 MG tablet Take 1 tablet (10 mg total) by mouth daily. (Patient taking differently: Take 10 mg by mouth at bedtime.) 90 tablet 0 07/01/2023   famotidine (PEPCID) 20 MG tablet Take 20 mg by mouth at bedtime.   07/01/2023   nitrofurantoin, macrocrystal-monohydrate, (MACROBID) 100 MG capsule Take 100 mg by mouth daily.  07/01/2023   Prenatal Vit-Fe Fumarate-FA (MULTIVITAMIN-PRENATAL) 27-0.8 MG TABS tablet Take 1 tablet by mouth at bedtime.   07/01/2023   ondansetron (ZOFRAN) 4 MG tablet Take 1 tablet (4 mg total) by mouth every 8 (eight) hours as needed for nausea or vomiting. (Patient not taking: Reported on 06/29/2023) 20 tablet 0      Review of Systems: Negative except as above.  Physical Exam: Vital signs and nursing notes reviewed.  Patient Vitals for the past 24 hrs:  BP Temp Temp src  Pulse Resp SpO2 Weight  07/02/23 0923 119/67 97.8 F (36.6 C) Oral 76 14 99 % --  07/02/23 0912 -- -- -- -- -- -- 77.7 kg     General: AAO x 3, NAD Heart: RRR Lungs:CTAB Abdomen: Gravid, NT Extremities: no edema SVE: Dilation: 1 Effacement (%): 70 Cervical Position: Posterior Station: -2 Presentation: Vertex Exam by:: Holly Flippin RN   FHR: 135 BPM, moderate variability, + accels, - decels TOCO: Contractions q4-29min  Labs:   Recent Labs    07/01/23 1035  WBC 10.3  HGB 14.0  HCT 39.7  PLT 257    Assessment/Plan: 29 y.o. G2P1001 at [redacted]w[redacted]d with hx of prior LTCS x1 for cord prolapse presenting for contractions. Patient scheduled for eRCS at 1pm today. Patient in early/prodromal labor. Discussed with patient at length options for continuing with C/S as scheduled, TOLAC with or without augmentation. Advised that if cervix with no or minimal change and desires TOLAC would recommend either augmentation with pitocin or discharge home. Patient would like to wait a few hours and decide based on repeat cervical exam.   Fetal wellbeing - FHT category 1 EFW 3300g by LM  Labor: TOLAC cs 2' eRCS pending repeat cervical exam  GBS neg Rubella immune Rh neg, s/p Rhogam @ 28w, plan PP Rhogam Tdap - declined prenatal   Pain control: desires epidural Analgesia/anesthesia PRN  Anticipated MOD: TOLAC vs 2' eRCS   POC discussed with patient and support team, all questions answered.  Antony Salmon D'iorio, MD 07/02/2023, 11:13 AM   ADDENDUM 07/02/2023, 11:57 AM Patient with unchanged cervical exam. Plan to proceed with 2' RCS as scheduled.   Marlene Bast, MD

## 2023-07-02 NOTE — Op Note (Signed)
 Cesarean Section Procedure Note   Tiffany Hester  07/02/2023 Procedure: Repeat Low transverse Cesarean section  Indications: 39.5 weeks, presented to labor check but due to no change in cervix desired to proceed with repeat cesarean section that was scheduled today.  1st c/section was emergency for cord prolapse, so she didn't want another emergency and declined TOLAC.   Pre-operative Diagnosis: Elective repeat cesarean section.   Post-operative Diagnosis: Same   Surgeon: Shea Evans, MD   Assistants: none  Anesthesia: spinal   Procedure Details:  The patient was seen in the Holding Room. The risks, benefits, complications, treatment options, and expected outcomes were discussed with the patient. The patient concurred with the proposed plan, giving informed consent. identified as Tiffany Hester and the procedure verified as C-Section Delivery. A Time Out was held and the above information confirmed.  After induction of anesthesia, the patient was draped and prepped in the usual sterile manner, foley was draining urine well.  A pfannenstiel incision was made and carried down through the subcutaneous tissue to the fascia. Fascial incision was made and extended transversely. The fascia was separated from the underlying rectus tissue superiorly and inferiorly. The peritoneum was identified and entered. Peritoneal incision was extended longitudinally. Alexis-O retractor placed. The utero-vesical peritoneal reflection was incised transversely and the bladder flap was bluntly freed from the lower uterine segment. A low transverse uterine incision was made. Thin meconium stained amniotic fluid noted. Delivered from cephalic presentation was a female infant with vigorous cry. Apgar scores of 8 at one minute and 8 at five minutes. There was a very small knick on left upper cheek with scalpel, Peds RN informed and pressure held.  Delayed cord clamping done at 1 minute and baby handed to NICU team in  attendance. Cord ph was not sent. Cord blood was obtained for evaluation. The placenta was removed Intact and appeared normal. Lower segment was atonic. TXA started.  The uterine outline, tubes and ovaries appeared normal. The uterine incision was closed with running locked sutures of in single layer. Hemostasis was observed. Alexis retractor removed. Peritoneal closure done with 2-0 Vicryl. The fascia was then reapproximated with running sutures of 0Vicryl. The subcuticular closure was performed using 2-0plain gut. The skin was closed with 4-0Vicryl.   Instrument, sponge, and needle counts were correct prior the abdominal closure and were correct at the conclusion of the case.   Findings:  Make infant, cephalic delivery from [facility] hysterotomy, Single layer closure Apgars 8 and 8    Estimated Blood Loss: 464 cc  Total IV Fluids: 2000 ml  LR (including in triage)  Urine Output:  100CC OF clear urine  Specimens: cord blood  Complications: no complications  Disposition: PACU - hemodynamically stable.   Maternal Condition: stable   Baby condition / location:  Couplet care / Skin to Skin  Attending Attestation: I performed the procedure.   Signed: Surgeon(s): Shea Evans, MD

## 2023-07-02 NOTE — Lactation Note (Signed)
 This note was copied from a baby's chart. Lactation Consultation Note  Patient Name: Tiffany Hester [id number] Date: 07/02/2023 Age:30 hours Reason for consult: Initial assessment;Term;Breastfeeding assistance  P2- MOB reports that infant nursed extremely well for the first two feedings, but now he is sleepy and will not nurse. LC offered to assist and MOB consented. LC placed infant on the right breast in the cross cradle hold. Even with stimulation, infant would not latch for 10 minutes. At this time, LC encouraged hand expressing EBM to offer a little stimulation. MOB was able to hand express 1 mL of EBM within 3 minutes. MOB then spoon fed this EBM to infant. Infant finally perked up and latched top the right breast. Infant had a strong rhythmic suck at this time. MOB denies experiencing any pain or pinching when infant nurses.   LC reviewed the first 24 hr birthday nap, day 2 cluster feeding, feeding infant on cue 8-12x in 24 hrs, not allowing infant to go over 3 hrs without a feeding, CDC milk storage guidelines, LC services handout and engorgement/breast care. LC encouraged MOB to call for further assistance as needed. MOB has a hx of an extreme over supply and high lipase.  Maternal Data Has patient been taught Hand Expression?: Yes Does the patient have breastfeeding experience prior to this delivery?: Yes How long did the patient breastfeed?: 20 months  Feeding Mother's Current Feeding Choice: Breast Milk  LATCH Score Latch: Grasps breast easily, tongue down, lips flanged, rhythmical sucking.  Audible Swallowing: Spontaneous and intermittent  Type of Nipple: Everted at rest and after stimulation  Comfort (Breast/Nipple): Soft / non-tender  Hold (Positioning): Assistance needed to correctly position infant at breast and maintain latch.  LATCH Score: 9   Lactation Tools Discussed/Used Pump Education: Milk Storage  Interventions Interventions: Breast feeding basics  reviewed;Assisted with latch;Hand express;Breast compression;Adjust position;Support pillows;Position options;Expressed milk;Education;LC Services brochure  Discharge Discharge Education: Engorgement and breast care;Warning signs for feeding baby Pump: DEBP;Personal  Consult Status Consult Status: Follow-up Date: 07/03/23 Follow-up type: In-patient    Dema Severin BS, IBCLC 07/02/2023, 9:09 PM

## 2023-07-02 NOTE — Anesthesia Preprocedure Evaluation (Addendum)
 Anesthesia Evaluation  Patient identified by MRN, date of birth, ID band Patient awake    Reviewed: Allergy & Precautions, NPO status , Patient's Chart, lab work & pertinent test results  History of Anesthesia Complications Negative for: history of anesthetic complications  Airway Mallampati: I  TM Distance: >3 FB Neck ROM: Full    Dental  (+) Dental Advisory Given, Teeth Intact   Pulmonary asthma , former smoker   Pulmonary exam normal        Cardiovascular hypertension (PIH), Normal cardiovascular exam     Neuro/Psych  PSYCHIATRIC DISORDERS Anxiety Depression    negative neurological ROS     GI/Hepatic Neg liver ROS,GERD  Medicated and Controlled,,  Endo/Other  negative endocrine ROS    Renal/GU negative Renal ROS     Musculoskeletal negative musculoskeletal ROS (+)    Abdominal   Peds  Hematology negative hematology ROS (+)  Plt 257k    Anesthesia Other Findings   Reproductive/Obstetrics (+) Pregnancy                             Anesthesia Physical Anesthesia Plan  ASA: 2  Anesthesia Plan: Spinal   Post-op Pain Management: Tylenol PO (pre-op)*   Induction:   PONV Risk Score and Plan: 2 and Treatment may vary due to age or medical condition, Ondansetron and Scopolamine patch - Pre-op  Airway Management Planned: Natural Airway  Additional Equipment: None  Intra-op Plan:   Post-operative Plan:   Informed Consent: I have reviewed the patients History and Physical, chart, labs and discussed the procedure including the risks, benefits and alternatives for the proposed anesthesia with the patient or authorized representative who has indicated his/her understanding and acceptance.       Plan Discussed with: CRNA and Anesthesiologist  Anesthesia Plan Comments: (Labs reviewed, platelets acceptable. Discussed risks and benefits of spinal, including spinal/epidural hematoma,  infection, failed block, and PDPH. Patient expressed understanding and wished to proceed. )        Anesthesia Quick Evaluation

## 2023-07-02 NOTE — MAU Note (Signed)
..  Tiffany Hester is a 28 y.o. at [redacted]w[redacted]d here in MAU reporting: CTX that started at 0547 this morning, states they are about every 5-7 minutes apart. Denies VB or LOF. +FM.   Pain score: 7 Vitals:   07/02/23 0923  BP: 119/67  Pulse: 76  Resp: 14  Temp: 97.8 F (36.6 C)  SpO2: 99%     FHT:129 Lab orders placed from triage:  mau labor

## 2023-07-02 NOTE — Transfer of Care (Signed)
 Immediate Anesthesia Transfer of Care Note  Patient: Tiffany Hester  Procedure(s) Performed: CESAREAN DELIVERY  Patient Location: PACU  Anesthesia Type:Spinal  Level of Consciousness: awake  Airway & Oxygen Therapy: Patient Spontanous Breathing  Post-op Assessment: Report given to RN  Post vital signs: Reviewed and stable  Last Vitals:  Vitals Value Taken Time  BP 115/64 07/02/23 1500  Temp    Pulse 62 07/02/23 1504  Resp 16 07/02/23 1504  SpO2 99 % 07/02/23 1504  Vitals shown include unfiled device data.  Last Pain:  Vitals:   07/02/23 0923  TempSrc: Oral  PainSc:          Complications: No notable events documented.

## 2023-07-02 NOTE — Anesthesia Procedure Notes (Signed)
 Spinal  Patient location during procedure: OR Start time: 07/02/2023 1:43 PM End time: 07/02/2023 1:46 PM Reason for block: surgical anesthesia Staffing Performed: anesthesiologist  Anesthesiologist: Beryle Lathe, MD Performed by: Beryle Lathe, MD Authorized by: Beryle Lathe, MD   Preanesthetic Checklist Completed: patient identified, IV checked, risks and benefits discussed, surgical consent, monitors and equipment checked, pre-op evaluation and timeout performed Spinal Block Patient position: sitting Prep: DuraPrep Patient monitoring: heart rate, cardiac monitor, continuous pulse ox and blood pressure Approach: midline Location: L3-4 Injection technique: single-shot Needle Needle type: Pencan  Needle gauge: 24 G Additional Notes Consent was obtained prior to the procedure with all questions answered and concerns addressed. Risks including, but not limited to, bleeding, infection, nerve damage, paralysis, failed block, inadequate analgesia, allergic reaction, high spinal, itching, and headache were discussed and the patient wished to proceed. Functioning IV was confirmed and monitors were applied. Sterile prep and drape, including hand hygiene, mask, and sterile gloves were used. The patient was positioned and the spine was prepped. The skin was anesthetized with lidocaine. Free flow of clear CSF was obtained prior to injecting local anesthetic into the CSF. The spinal needle aspirated freely following injection. The needle was carefully withdrawn. The patient tolerated the procedure well.   Leslye Peer, MD

## 2023-07-03 LAB — CBC
HCT: 33.1 % — ABNORMAL LOW (ref 36.0–46.0)
Hemoglobin: 11.4 g/dL — ABNORMAL LOW (ref 12.0–15.0)
MCH: 31 pg (ref 26.0–34.0)
MCHC: 34.4 g/dL (ref 30.0–36.0)
MCV: 89.9 fL (ref 80.0–100.0)
Platelets: 234 10*3/uL (ref 150–400)
RBC: 3.68 MIL/uL — ABNORMAL LOW (ref 3.87–5.11)
RDW: 13 % (ref 11.5–15.5)
WBC: 18.7 10*3/uL — ABNORMAL HIGH (ref 4.0–10.5)
nRBC: 0 % (ref 0.0–0.2)

## 2023-07-03 MED ORDER — RHO D IMMUNE GLOBULIN 1500 UNIT/2ML IJ SOSY
300.0000 ug | PREFILLED_SYRINGE | Freq: Once | INTRAMUSCULAR | Status: AC
  Administered 2023-07-03: 300 ug via INTRAVENOUS
  Filled 2023-07-03: qty 2

## 2023-07-03 NOTE — Progress Notes (Signed)
 Post Partum Day 1  repeat LTCS in early labor Subjective: no complaints, up ad lib, voiding, tolerating PO, and + flatus Pain very well controlled this time   Objective: Blood pressure 95/61, pulse (!) 54, temperature 98.2 F (36.8 C), temperature source Oral, resp. rate 16, weight 77.7 kg, SpO2 99%, currently breastfeeding.  Physical Exam:  General: alert and cooperative Lungs CTA  CV RRR  Lochia: appropriate Uterine Fundus: firm NABS, non tender  Incision: healing well, no significant drainage DVT Evaluation: No evidence of DVT seen on physical exam. Negative Homan's sign.     Latest Ref Rng & Units 07/03/2023    5:25 AM 07/01/2023   10:35 AM 06/22/2023    3:29 AM  CBC  WBC 4.0 - 10.5 K/uL 18.7  10.3  15.8   Hemoglobin 12.0 - 15.0 g/dL 98.1  19.1  47.8   Hematocrit 36.0 - 46.0 % 33.1  39.7  42.0   Platelets 150 - 400 K/uL 234  257  225     O Neg --> needs Rhogam since baby is O+  Rub Imm   Assessment/Plan: PPD #1, post op RC/s  Routine care, pain mngmt LC to assist Boy- circumcision request- consents and reviewed plan  Anticipate Dc tomorrow    LOS: 1 day   Robley Fries, MD 07/03/2023, 8:45 AM

## 2023-07-03 NOTE — Progress Notes (Signed)
 CSW received a consult due to anxiety and depression. CSW met MOB at bedside to complete an mental health assessment and offer support. CSW entered the room, introduced herself and acknowledged FOB was present. MOB gave CSW verbal permission to speak about anything while FOB was present. CSW explained her role and the reason for the visit. CSW observed the infant safely sleeping in the bassinet and MOB was laying comfortably in bed. MOB was polite, easy to engage, receptive to meeting with CSW, and appeared forthcoming.  CSW inquired about MOB's mental health history. MOB reported being diagnosed with anxiety and depression in 2013 at the end of high school. MOB reported over time being prescribed various medications for support and currently being prescribed Celexa for the last 8 years. MOB reported in the past participating in therapy; however did not find sessions helpful and she declined resources at this time. MOB reported PPD with her first pregnancy with symptoms that included heightened anxiety; and she contacted her OB/PCP for support. MOB reported increasing her Celexa during her 1st PPD period and has the same plan for this PP period if PPD symptoms return. MOB reported her supports as FOB and her mom. CSW provided education regarding the baby blues period vs. perinatal mood disorders, discussed treatment and gave resources for mental health follow up if concerns arise.  CSW recommends self-evaluation during the postpartum time period using the New Mom Checklist from Postpartum Progress and encouraged MOB to contact a medical professional if symptoms are noted at any time.  CSW assessed for safety with MOB SI and HI; MOB denied all. CSW did not assess for DV; FOB was present.  CSW asked MOB has she selected a pediatrician for the infant's follow up visits; MOB said [facility] - [place].  MOB reported having all essential items for the infant including a carseat, bassinet and crib for safe  sleeping. CSW provided review of Sudden Infant Death Syndrome (SIDS) precautions.   CSW identifies no further need for intervention and no barriers to discharge at this time.  Enos Fling, Theresia Majors Clinical Social Worker [phone]

## 2023-07-03 NOTE — Anesthesia Postprocedure Evaluation (Signed)
 Anesthesia Post Note  Patient: Tiffany Hester  Procedure(s) Performed: CESAREAN DELIVERY     Patient location during evaluation: PACU Anesthesia Type: Spinal Level of consciousness: awake and alert Pain management: pain level controlled Vital Signs Assessment: post-procedure vital signs reviewed and stable Respiratory status: spontaneous breathing and respiratory function stable Cardiovascular status: blood pressure returned to baseline and stable Postop Assessment: spinal receding and no apparent nausea or vomiting Anesthetic complications: no  No notable events documented.  Last Vitals:  Vitals:   07/02/23 2315 07/03/23 0312  BP: (!) 106/58 98/60  Pulse: (!) 57 (!) 51  Resp: 16 16  Temp: 37.2 C 36.8 C  SpO2: 98% 99%    Last Pain:  Vitals:   07/03/23 0313  TempSrc:   PainSc: 0-No pain                 Beryle Lathe

## 2023-07-03 NOTE — Lactation Note (Signed)
 This note was copied from a baby's chart. Lactation Consultation Note  Patient Name: Tiffany Hester [id number] Date: 07/03/2023 Age:30 hours Reason for consult: Follow-up assessment;Term (Infant with weight loss -4.69%) Per MOB, infant recently NF 1 hour ago, most feedings today are 20 to 35 minutes in length, infant has started to cluster feed. MOB is experienced with BF, she BF her daughter who is almost 3 years for 21 months. MOB informed LC she has hx of oversupply. MOB easily expressed 3 mls of colostrum she spoon fed to infant. MOB will continue to BF infant by cues and on demand today, knows if infant is still cuing after feeding on the 1st breast to offer 2nd breast during the same feeding and can give EBM by spoon afterwards. MOB does not have questions for LC at this time.   Maternal Data    Feeding ( Latch assessment done by RN earlier today) see flow sheet.  Mother's Current Feeding Choice: Breast Milk  LATCH Score Latch: Grasps breast easily, tongue down, lips flanged, rhythmical sucking.  Audible Swallowing: Spontaneous and intermittent  Type of Nipple: Everted at rest and after stimulation  Comfort (Breast/Nipple): Soft / non-tender  Hold (Positioning): No assistance needed to correctly position infant at breast.  LATCH Score: 10   Lactation Tools Discussed/Used    Interventions Interventions: Skin to skin;Hand express;Position options;Education  Discharge    Consult Status Consult Status: Follow-up Date: 07/04/23 Follow-up type: In-patient    Frederico Hamman 07/03/2023, 9:11 PM

## 2023-07-04 LAB — RH IG WORKUP (INCLUDES ABO/RH)
Fetal Screen: NEGATIVE
Gestational Age(Wks): 39.5
Unit division: 0

## 2023-07-04 MED ORDER — IBUPROFEN 600 MG PO TABS: 600.0000 mg | ORAL_TABLET | Freq: Four times a day (QID) | ORAL | 0 refills | Status: AC

## 2023-07-04 MED ORDER — ACETAMINOPHEN 325 MG PO TABS: 650.0000 mg | ORAL_TABLET | ORAL | Status: AC | PRN

## 2023-07-04 MED ORDER — OXYCODONE HCL 5 MG PO TABS
5.0000 mg | ORAL_TABLET | ORAL | 0 refills | Status: AC | PRN
Start: 2023-07-04 — End: ?

## 2023-07-04 MED ORDER — SENNOSIDES-DOCUSATE SODIUM 8.6-50 MG PO TABS: 2.0000 | ORAL_TABLET | Freq: Every day | ORAL | Status: AC

## 2023-07-04 MED ORDER — SIMETHICONE 80 MG PO CHEW: 80.0000 mg | CHEWABLE_TABLET | ORAL | Status: AC | PRN

## 2023-07-04 NOTE — Lactation Note (Signed)
 This note was copied from a baby's chart. Lactation Consultation Note  Patient Name: Tiffany Hester [id number] Date: 07/04/2023 Age:30 hours Reason for consult: Maternal discharge;Term  P2, 39 wks, @ 44 hrs of life. Discharge anticipated today. Experienced breast feeder. Encouraged mom to keep working on big mouth latch with baby and use EBM or coconut oil after each feed. Discussed cluster feeding overnight/ early morning brings in our milk supply, shared expectations of milk coming in. Highlighted risk of engorgement. Discussed hand pump/express to soften breasts, motrin as anti-inflammatory, and ice packs for 10-20 minutes post feed/pumping if still over-full is the best treatments for inflamed/engorged breasts. LC services and milk storage re-enforced with mom.  Maternal Data Has patient been taught Hand Expression?: Yes Does the patient have breastfeeding experience prior to this delivery?: Yes How long did the patient breastfeed?: Until pregnant with baby 1 1/2 yrs +  Feeding Mother's Current Feeding Choice: Breast Milk  LATCH Score Latch: Grasps breast easily, tongue down, lips flanged, rhythmical sucking.  Audible Swallowing: A few with stimulation  Type of Nipple: Everted at rest and after stimulation  Comfort (Breast/Nipple): Soft / non-tender  Hold (Positioning): No assistance needed to correctly position infant at breast.  LATCH Score: 9   Lactation Tools Discussed/Used    Interventions Interventions: Breast feeding basics reviewed;Hand express;Breast compression;Expressed milk;Hand pump;Education;LC Services brochure;CDC milk storage guidelines  Discharge Discharge Education: Engorgement and breast care Pump: Personal;Manual;DEBP (Per mom has electric and hand pump @ home)  Consult Status Consult Status: Complete Date: 07/04/23    Idamae Lusher 07/04/2023, 11:11 AM

## 2023-07-04 NOTE — Discharge Summary (Signed)
 Postpartum Discharge Summary  Date of Service updated 07/04/23     Patient Name: Tiffany Hester DOB: 29-Mar-1996 MRN: [id number]  Date of admission: 07/02/2023 Delivery date:07/02/2023 Delivering provider: MODY, VAISHALI Date of discharge: 07/04/2023  Admitting diagnosis: Previous cesarean section [Z98.891] Status post repeat low transverse cesarean section [Z98.891] Intrauterine pregnancy: [redacted]w[redacted]d     Secondary diagnosis:  Principal Problem:   Previous cesarean section Active Problems:   Status post repeat low transverse cesarean section  Additional problems: none    Discharge diagnosis: Term Pregnancy Delivered                                              Post partum procedures: n/a Augmentation: N/A Complications: None  Hospital course: Sceduled C/S   28 y.o. yo G2P2002 at [redacted]w[redacted]d was admitted to the hospital 07/02/2023 for scheduled cesarean section with the following indication:Elective Repeat.Delivery details are as follows:  Membrane Rupture Time/Date: 2:13 PM,07/02/2023  Delivery Method:C-Section, Low Transverse Operative Delivery:N/A Details of operation can be found in separate operative note.  Patient had a postpartum course complicated by none.  She is ambulating, tolerating a regular diet, passing flatus, and urinating well. Patient is discharged home in stable condition on  07/04/23        Newborn Data: Birth date:07/02/2023 Birth time:2:13 PM Gender:Female Living status:Living Apgars:8 ,8  Weight:3520 g    Magnesium Sulfate received: No BMZ received: No Rhophylac:N/A MMR:N/A T-DaP:? Transfusion:No Immunizations administered: There is no immunization history for the selected administration types on file for this patient.  Physical exam  Vitals:   07/03/23 0730 07/03/23 1430 07/03/23 2135 07/04/23 0523  BP: 95/61 (!) 99/53 109/62 105/68  Pulse: (!) 54 (!) 55 61 60  Resp: 16  16 16   Temp: 98.2 F (36.8 C) 98.1 F (36.7 C) 98.1 F (36.7 C) 98.1 F (36.7  C)  TempSrc: Oral Oral Oral Oral  SpO2: 99%  96% 98%  Weight:       General: alert and no distress Lochia: appropriate Uterine Fundus: firm Incision: Healing well with no significant drainage DVT Evaluation: No evidence of DVT seen on physical exam. Labs: Lab Results  Component Value Date   WBC 18.7 (H) 07/03/2023   HGB 11.4 (L) 07/03/2023   HCT 33.1 (L) 07/03/2023   MCV 89.9 07/03/2023   PLT 234 07/03/2023      Latest Ref Rng & Units 06/22/2023    3:29 AM  CMP  Glucose 70 - 99 mg/dL 409   BUN 6 - 20 mg/dL 11   Creatinine 8.11 - 1.00 mg/dL 9.14   Sodium 782 - 956 mmol/L 134   Potassium 3.5 - 5.1 mmol/L 3.6   Chloride 98 - 111 mmol/L 104   CO2 22 - 32 mmol/L 19   Calcium 8.9 - 10.3 mg/dL 8.8   Total Protein 6.5 - 8.1 g/dL 6.4   Total Bilirubin 0.0 - 1.2 mg/dL 1.0   Alkaline Phos 38 - 126 U/L 171   AST 15 - 41 U/L 22   ALT 0 - 44 U/L 9    Edinburgh Score:    07/02/2023    9:30 PM  Edinburgh Postnatal Depression Scale Screening Tool  I have been able to laugh and see the funny side of things. 0  I have looked forward with enjoyment to things. 0  I have blamed myself unnecessarily  when things went wrong. 1  I have been anxious or worried for no good reason. 1  I have felt scared or panicky for no good reason. 1  Things have been getting on top of me. 1  I have been so unhappy that I have had difficulty sleeping. 0  I have felt sad or miserable. 0  I have been so unhappy that I have been crying. 0  The thought of harming myself has occurred to me. 0  Edinburgh Postnatal Depression Scale Total 4      After visit meds:  Allergies as of 07/04/2023       Reactions   Other    Sutures - Caused irritation    Tape    Irritation         Medication List     TAKE these medications    acetaminophen 325 MG tablet Commonly known as: TYLENOL Take 2 tablets (650 mg total) by mouth every 4 (four) hours as needed for mild pain (pain score 1-3) (temperature >  101.5.).   citalopram 10 MG tablet Commonly known as: CELEXA Take 1 tablet (10 mg total) by mouth daily. What changed: when to take this   famotidine 20 MG tablet Commonly known as: PEPCID Take 20 mg by mouth at bedtime.   ibuprofen 600 MG tablet Commonly known as: ADVIL Take 1 tablet (600 mg total) by mouth every 6 (six) hours.   multivitamin-prenatal 27-0.8 MG Tabs tablet Take 1 tablet by mouth at bedtime.   oxyCODONE 5 MG immediate release tablet Commonly known as: Oxy IR/ROXICODONE Take 1-2 tablets (5-10 mg total) by mouth every 4 (four) hours as needed for moderate pain (pain score 4-6).   senna-docusate 8.6-50 MG tablet Commonly known as: Senokot-S Take 2 tablets by mouth daily. Start taking on: July 05, 2023   simethicone 80 MG chewable tablet Commonly known as: MYLICON Chew 1 tablet (80 mg total) by mouth as needed for flatulence.               Discharge Care Instructions  (From admission, onward)           Start     Ordered   07/04/23 0000  Discharge wound care:       Comments: May remove Honeycomb dressing by 1 week postpartum. May remove underlying steri strips by 2 weeks postpartum. Keep incision clean and dry.   07/04/23 1201             Discharge home in stable condition Infant Feeding: Breast Infant Disposition:home with mother Discharge instruction: per After Visit Summary and Postpartum booklet. Activity: Advance as tolerated. Pelvic rest for 6 weeks.  Diet: routine diet Anticipated Birth Control: Unsure Postpartum Appointment:6 weeks Additional Postpartum F/U:  n/a Future Appointments:No future appointments. Follow up Visit:  Patient to follow up with Dr. Juliene Pina at [facility] for 6 week postpartum visit or sooner as needed    07/04/2023 Tamecca Artiga A Analeigha Nauman, DO

## 2023-07-06 ENCOUNTER — Inpatient Hospital Stay (HOSPITAL_COMMUNITY): Admitting: Anesthesiology

## 2023-07-06 ENCOUNTER — Encounter (HOSPITAL_COMMUNITY): Payer: Self-pay | Admitting: Obstetrics and Gynecology

## 2023-07-06 ENCOUNTER — Inpatient Hospital Stay (HOSPITAL_COMMUNITY)
Admission: AD | Admit: 2023-07-06 | Discharge: 2023-07-06 | Disposition: A | Discharge status: Home / Self Care | Attending: Obstetrics and Gynecology | Admitting: Obstetrics and Gynecology

## 2023-07-06 DIAGNOSIS — O894 Spinal and epidural anesthesia-induced headache during the puerperium: Secondary | ICD-10-CM | POA: Insufficient documentation

## 2023-07-06 DIAGNOSIS — G971 Other reaction to spinal and lumbar puncture: Secondary | ICD-10-CM | POA: Diagnosis not present

## 2023-07-06 LAB — PROTIME-INR
INR: 1.1 (ref 0.8–1.2)
Prothrombin Time: 14 s (ref 11.4–15.2)

## 2023-07-06 LAB — CBC
HCT: 37.6 % (ref 36.0–46.0)
Hemoglobin: 12.7 g/dL (ref 12.0–15.0)
MCH: 30.9 pg (ref 26.0–34.0)
MCHC: 33.8 g/dL (ref 30.0–36.0)
MCV: 91.5 fL (ref 80.0–100.0)
Platelets: 301 10*3/uL (ref 150–400)
RBC: 4.11 MIL/uL (ref 3.87–5.11)
RDW: 13 % (ref 11.5–15.5)
WBC: 10.9 10*3/uL — ABNORMAL HIGH (ref 4.0–10.5)
nRBC: 0 % (ref 0.0–0.2)

## 2023-07-06 LAB — APTT: aPTT: 30 s (ref 24–36)

## 2023-07-06 MED ORDER — KETOROLAC TROMETHAMINE 30 MG/ML IJ SOLN
30.0000 mg | Freq: Once | INTRAMUSCULAR | Status: AC
  Administered 2023-07-06: 30 mg via INTRAVENOUS
  Filled 2023-07-06: qty 1

## 2023-07-06 MED ORDER — LACTATED RINGERS IV BOLUS
1000.0000 mL | Freq: Once | INTRAVENOUS | Status: AC
  Administered 2023-07-06: 1000 mL via INTRAVENOUS

## 2023-07-06 MED ORDER — CYCLOBENZAPRINE HCL 5 MG PO TABS
10.0000 mg | ORAL_TABLET | Freq: Once | ORAL | Status: AC
  Administered 2023-07-06: 10 mg via ORAL
  Filled 2023-07-06: qty 2

## 2023-07-06 MED ORDER — FENTANYL CITRATE (PF) 100 MCG/2ML IJ SOLN
100.0000 ug | Freq: Once | INTRAMUSCULAR | Status: AC
  Administered 2023-07-06: 100 ug via INTRAVENOUS
  Filled 2023-07-06: qty 2

## 2023-07-06 NOTE — MAU Provider Note (Signed)
 MAU Provider Note  Chief Complaint: Headache   Event Date/Time   First Provider Initiated Contact with Patient 07/06/23 1446      SUBJECTIVE HPI: Tiffany Hester is a 28 y.o. [id number] POD#4 from rLTCS who presents to maternity admissions reporting severe headache. Pregnancy c/b none. Receives [facility] with [facility].  Patient had an uncomplicated repeat C-section 3/20.  Notes on her day of discharge after walking to the car, driving home, getting out of the car, she started to have more of a headache.  She notes that the headache has continued to get worse.  Feels like a tight band around her head.  Is mildly alleviated by laying down.  Does feel sensitive to light/noise.  Notes that Tylenol/ibuprofen has been unhelpful for pain.  Has taken oxycodone which helps somewhat but does not want to take this very frequently.  Has not had a fever/chills/heavy lochia, complications with incision, back pain.  HPI  Past Medical History:  Diagnosis Date   Anxiety    Asthma    Bilateral ovarian cysts    Chronic UTI (urinary tract infection)    Depression    Panic attack 12/11/2012   Pregnancy induced hypertension    Right ovarian cyst 07/26/2020   Past Surgical History:  Procedure Laterality Date   CESAREAN SECTION N/A 06/21/2021   Procedure: CESAREAN SECTION;  Surgeon: Shea Evans, MD;  Location: [facility] LD ORS;  Service: Obstetrics;  Laterality: N/A;   CESAREAN SECTION N/A 07/02/2023   Procedure: CESAREAN DELIVERY;  Surgeon: Shea Evans, MD;  Location: [facility] LD ORS;  Service: Obstetrics;  Laterality: N/A;  REPEAT C/S EDD: 07/04/23   LAPAROSCOPY N/A 07/26/2020   Procedure: LAPAROSCOPY OPERATIVE;  Surgeon: Sherian Rein, MD;  Location: [facility] OR;  Service: Gynecology;  Laterality: N/A;   OVARIAN CYST REMOVAL Right 07/26/2020   Procedure: OVARIAN CYSTECTOMY;  Surgeon: Sherian Rein, MD;  Location: [facility] OR;  Service: Gynecology;  Laterality: Right;   TONSILLECTOMY     Social History    Socioeconomic History   Marital status: Married    Spouse name: Not on file   Number of children: Not on file   Years of education: Not on file   Highest education level: Not on file  Occupational History   Not on file  Tobacco Use   Smoking status: Former    Current packs/day: 0.00    Types: Cigarettes    Quit date: 06/07/2019    Years since quitting: 4.0   Smokeless tobacco: Never  Vaping Use   Vaping status: Former   Substances: Nicotine  Substance and Sexual Activity   Alcohol use: Not Currently    Comment: ocassionally   Drug use: Not Currently    Types: Marijuana    Comment: no longer using   Sexual activity: Yes    Partners: Male  Other Topics Concern   Not on file  Social History Narrative   ** Merged History Encounter **       Social Drivers of Health   Financial Resource Strain: Low Risk  (04/17/2022)   Received from [facility], [facility]   Overall Financial Resource Strain (CARDIA)    Difficulty of Paying Living Expenses: Not hard at all  Food Insecurity: Patient Declined (07/02/2023)   Hunger Vital Sign    Worried About Running Out of Food in the Last Year: Patient declined    Ran Out of Food in the Last Year: Patient declined  Transportation Needs: Patient Declined (07/02/2023)   PRAPARE - Transportation  Lack of Transportation (Medical): Patient declined    Lack of Transportation (Non-Medical): Patient declined  Physical Activity: Not on file  Stress: Not on file  Social Connections: Unknown (08/26/2021)   Received from [facility], [facility]   Social Network    Social Network: Not on file  Intimate Partner Violence: Patient Declined (07/02/2023)   Humiliation, Afraid, Rape, and Kick questionnaire    Fear of Current or Ex-Partner: Patient declined    Emotionally Abused: Patient declined    Physically Abused: Patient declined    Sexually Abused: Patient declined   No current facility-administered medications on file prior to  encounter.   Current Outpatient Medications on File Prior to Encounter  Medication Sig Dispense Refill   acetaminophen (TYLENOL) 325 MG tablet Take 2 tablets (650 mg total) by mouth every 4 (four) hours as needed for mild pain (pain score 1-3) (temperature > 101.5.).     citalopram (CELEXA) 10 MG tablet Take 1 tablet (10 mg total) by mouth daily. (Patient taking differently: Take 10 mg by mouth at bedtime.) 90 tablet 0   ibuprofen (ADVIL) 600 MG tablet Take 1 tablet (600 mg total) by mouth every 6 (six) hours. 30 tablet 0   oxyCODONE (OXY IR/ROXICODONE) 5 MG immediate release tablet Take 1-2 tablets (5-10 mg total) by mouth every 4 (four) hours as needed for moderate pain (pain score 4-6). 30 tablet 0   Prenatal Vit-Fe Fumarate-FA (MULTIVITAMIN-PRENATAL) 27-0.8 MG TABS tablet Take 1 tablet by mouth at bedtime.     famotidine (PEPCID) 20 MG tablet Take 20 mg by mouth at bedtime.     senna-docusate (SENOKOT-S) 8.6-50 MG tablet Take 2 tablets by mouth daily.     simethicone (MYLICON) 80 MG chewable tablet Chew 1 tablet (80 mg total) by mouth as needed for flatulence.     [DISCONTINUED] dexmethylphenidate (FOCALIN XR) 10 MG 24 hr capsule Take 1 capsule (10 mg total) by mouth daily. 90 capsule 0   Allergies  Allergen Reactions   Other     Sutures - Caused irritation    Tape     Irritation     ROS:  Pertinent positives/negatives listed above.  I have reviewed patient's Past Medical Hx, Surgical Hx, Family Hx, Social Hx, medications and allergies.   Physical Exam  Patient Vitals for the past 24 hrs:  BP Temp Temp src Pulse Resp SpO2  07/06/23 1900 -- -- -- 62 -- 97 %  07/06/23 1855 116/71 -- -- (!) 58 16 98 %  07/06/23 1850 -- -- -- -- -- 98 %  07/06/23 1845 -- -- -- -- -- 99 %  07/06/23 1840 -- -- -- -- -- 99 %  07/06/23 1835 -- -- -- -- -- 98 %  07/06/23 1830 116/74 -- -- 65 16 --  07/06/23 1440 (!) 100/59 -- -- 65 -- 100 %  07/06/23 1425 111/63 98.3 F (36.8 C) Oral 72 16 100 %    Constitutional: Well-developed, well-nourished female. Appears in pain. In dark room Cardiovascular: normal rate Respiratory: normal effort GI: Abd soft, non-tender, appropriate tenderness along incision MS: Extremities nontender, no edema, normal ROM Neurologic: Alert and oriented x 4. No focal findings, symmetric movement of extremities GU: Neg CVAT  LAB RESULTS Results for orders placed or performed during the hospital encounter of 07/06/23 (from the past 24 hours)  CBC     Status: Abnormal   Collection Time: 07/06/23  6:06 PM  Result Value Ref Range   WBC 10.9 (H) 4.0 -  10.5 K/uL   RBC 4.11 3.87 - 5.11 MIL/uL   Hemoglobin 12.7 12.0 - 15.0 g/dL   HCT 16.1 09.6 - 04.5 %   MCV 91.5 80.0 - 100.0 fL   MCH 30.9 26.0 - 34.0 pg   MCHC 33.8 30.0 - 36.0 g/dL   RDW 40.9 81.1 - 91.4 %   Platelets 301 150 - 400 K/uL   nRBC 0.0 0.0 - 0.2 %  Protime-INR     Status: None   Collection Time: 07/06/23  6:06 PM  Result Value Ref Range   Prothrombin Time 14.0 11.4 - 15.2 seconds   INR 1.1 0.8 - 1.2  APTT     Status: None   Collection Time: 07/06/23  6:06 PM  Result Value Ref Range   aPTT 30 24 - 36 seconds    --/--/O NEG (03/19 1040)  IMAGING No results found.  MAU Management/MDM: Orders Placed This Encounter  Procedures   CBC   Protime-INR   APTT   Discharge patient    Meds ordered this encounter  Medications   fentaNYL (SUBLIMAZE) injection 100 mcg   cyclobenzaprine (FLEXERIL) tablet 10 mg   lactated ringers bolus 1,000 mL   ketorolac (TORADOL) 30 MG/ML injection 30 mg     Available prenatal records reviewed.  Patient presents with worsening headache on POD#4 from repeat C-section.  Pain is consistent with spinal headache.  She does not have neurologic findings, fever, other red flag symptoms to suggest CVA, meningitis, other intracranial process that would require imaging.  Post-op CBC showed extremely mild anemia. Will treat symptoms with fentanyl, Toradol, LR.  Could  consider a blood patch if these are ineffective.  1617: Patient not feeling much improvement with above interventions.  Does feel improved pain while lying down, however sat up to breast feed and pain immediately back to 9/10. Called anesthesia, Dr. Renold Don will come to evaluate patient at the bedside.  1911: Anesthesia evaluated patient and offered blood patch. Please see Dr. Janice Norrie note for full details of procedure. Patient will be observed for 30 minutes after procedure, then discharged.  ASSESSMENT 1. Spinal headache   2. Postpartum care following cesarean delivery     PLAN Discharge home with strict return precautions. Allergies as of 07/06/2023       Reactions   Other    Sutures - Caused irritation    Tape    Irritation         Medication List     TAKE these medications    acetaminophen 325 MG tablet Commonly known as: TYLENOL Take 2 tablets (650 mg total) by mouth every 4 (four) hours as needed for mild pain (pain score 1-3) (temperature > 101.5.).   citalopram 10 MG tablet Commonly known as: CELEXA Take 1 tablet (10 mg total) by mouth daily. What changed: when to take this   famotidine 20 MG tablet Commonly known as: PEPCID Take 20 mg by mouth at bedtime.   ibuprofen 600 MG tablet Commonly known as: ADVIL Take 1 tablet (600 mg total) by mouth every 6 (six) hours.   multivitamin-prenatal 27-0.8 MG Tabs tablet Take 1 tablet by mouth at bedtime.   oxyCODONE 5 MG immediate release tablet Commonly known as: Oxy IR/ROXICODONE Take 1-2 tablets (5-10 mg total) by mouth every 4 (four) hours as needed for moderate pain (pain score 4-6).   senna-docusate 8.6-50 MG tablet Commonly known as: Senokot-S Take 2 tablets by mouth daily.   simethicone 80 MG chewable tablet Commonly known as:  MYLICON Chew 1 tablet (80 mg total) by mouth as needed for flatulence.         Wylene Simmer, MD OB Fellow 07/06/2023  7:11 PM

## 2023-07-06 NOTE — Anesthesia Preprocedure Evaluation (Signed)
 Anesthesia Evaluation  Patient identified by MRN, date of birth, ID band Patient awake    Reviewed: Allergy & Precautions, H&P , NPO status , Patient's Chart, lab work & pertinent test results  Airway Mallampati: II  TM Distance: >3 FB Neck ROM: Full    Dental no notable dental hx.    Pulmonary asthma , former smoker   Pulmonary exam normal breath sounds clear to auscultation       Cardiovascular Normal cardiovascular exam Rhythm:Regular Rate:Normal     Neuro/Psych  PSYCHIATRIC DISORDERS Anxiety Depression    negative neurological ROS     GI/Hepatic negative GI ROS, Neg liver ROS,,,  Endo/Other  negative endocrine ROS    Renal/GU negative Renal ROS  negative genitourinary   Musculoskeletal negative musculoskeletal ROS (+)    Abdominal   Peds negative pediatric ROS (+)  Hematology negative hematology ROS (+) Hb 11.4, plt 234   Anesthesia Other Findings   Reproductive/Obstetrics  POD#3 s/p C section                              Anesthesia Physical Anesthesia Plan  ASA: 2  Anesthesia Plan: Epidural   Post-op Pain Management:    Induction:   PONV Risk Score and Plan: 2  Airway Management Planned: Natural Airway  Additional Equipment: None  Intra-op Plan:   Post-operative Plan:   Informed Consent: I have reviewed the patients History and Physical, chart, labs and discussed the procedure including the risks, benefits and alternatives for the proposed anesthesia with the patient or authorized representative who has indicated his/her understanding and acceptance.       Plan Discussed with:   Anesthesia Plan Comments: (Epidural blood patch for PDPH )       Anesthesia Quick Evaluation

## 2023-07-06 NOTE — MAU Note (Signed)
 Zilda No is a 29 y.o. at Unknown here in MAU reporting: Rc/s on 3/20. HA started on  Sat after leaving the hospital.  Called today when office opened.  Meds were discussed. Told to come in .  Laying down helps a little, pain meds for c/s are not helping HA at all.  Feels an increase in pressure in her ears Vag bleeding WNL.  Appropriate incision discomfort.  Onset of complaint: Sat Pain score: HA 8, inc 5 Vitals:   07/06/23 1425  BP: 111/63  Pulse: 72  Resp: 16  Temp: 98.3 F (36.8 C)  SpO2: 100%      Lab orders placed from triage:

## 2023-07-06 NOTE — Anesthesia Procedure Notes (Signed)
 Epidural Patient location during procedure: OB Start time: 07/06/2023 6:35 PM End time: 07/06/2023 7:54 PM  Staffing Anesthesiologist: Lannie Fields, DO Performed: anesthesiologist   Preanesthetic Checklist Completed: patient identified, IV checked, risks and benefits discussed, monitors and equipment checked, pre-op evaluation and timeout performed  Epidural Patient position: sitting Prep: DuraPrep and site prepped and draped Patient monitoring: continuous pulse ox, blood pressure, heart rate and cardiac monitor Approach: midline Location: L3-L4 Injection technique: LOR air  Needle:  Needle type: Tuohy  Needle gauge: 17 G Needle length: 9 cm Needle insertion depth: 4 cm Catheter at skin depth: 9 cm  Assessment Sensory level: T8 Events: blood not aspirated, no cerebrospinal fluid, injection not painful, no injection resistance, no paresthesia and negative IV test  Additional Notes Patient identified. Risks/Benefits/Options discussed with patient including but not limited to bleeding, infection, nerve damage, paralysis, failure of the epidural blood patch to help with headaches, repeat wet tap.  Confirmed with bedside nurse the patient's most recent platelet count. Confirmed with patient that they are not currently taking any anticoagulation, have any bleeding history or any family history of bleeding disorders. Patient expressed understanding and wished to proceed. All questions were answered. Sterile technique was used throughout the entire procedure. Please see nursing notes for vital signs. Pt to lay flat for post-procedure.  Epidural injection of 20ml sterile blood for blood patch. Reason for block:procedure for pain

## 2023-07-06 NOTE — Anesthesia Postprocedure Evaluation (Signed)
 Anesthesia Post Note  Patient: Crystelle Ferrufino  Procedure(s) Performed: AN AD HOC BLOOD PATCH     Patient location during evaluation: MAU Anesthesia Type: Epidural Level of consciousness: awake and alert Pain management: pain level controlled Vital Signs Assessment: post-procedure vital signs reviewed and stable Respiratory status: spontaneous breathing, nonlabored ventilation and respiratory function stable Cardiovascular status: stable Postop Assessment: no headache and no backache Anesthetic complications: no Comments: S/p epidural blood patch, sat up to 45 and 90 degrees, still w/ no headache    No notable events documented.  Last Vitals:  Vitals:   07/06/23 1855 07/06/23 1900  BP: 116/71   Pulse: (!) 58 62  Resp: 16   Temp:    SpO2: 98% 97%    Last Pain:  Vitals:   07/06/23 1615  TempSrc:   PainSc: 8                  Lannie Fields

## 2023-07-06 NOTE — Progress Notes (Signed)
 Anesthesia Evaluation: Called to bedside for headache s/p CS under spinal ~ 4 days ago. States headache is worse with sitting or standing, better when she is lying flat, and is significantly affecting her ability to care for her baby. She denies weakness, numbness or tingling and is able to ambulate, but is limited due to the headache. She has had a 1 L bolus here today and toradol with no noticeable improvement in her symptoms. Denies blurry vision or diplopia. She has symmetric 5/5 strength throughout upper and lower extremities. 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 I discussed the options for conservative management vs epidural blood patch. She wishes to speak with her husband before deciding. Will send stat labs. I discussed the risks and benefits of epidural blood patch at length with patient and her mother at bedside. This includes, but is not limited to, bleeding, infection, reactions to the medications, damage to surrounding structures, damage to nerves, permanent weakness, numbness, tingling and pain. All patient questions were answered and patient wishes to proceed with epidural blood patch.

## 2023-07-12 ENCOUNTER — Inpatient Hospital Stay (HOSPITAL_COMMUNITY)

## 2023-07-14 ENCOUNTER — Telehealth (HOSPITAL_COMMUNITY): Payer: Self-pay

## 2023-07-14 NOTE — Telephone Encounter (Signed)
 07/14/2023 1343  Name: Tiffany Hester MRN: [id number] DOB: [birth year]  Reason for Call:  Transition of Care Hospital Discharge Call  Contact Status: Patient Contact Status: Complete  Language assistant needed:          Follow-Up Questions: Do You Have Any Concerns About Your Health As You Heal From Delivery?: Yes What Concerns Do You Have About Your Health?: Patient states that she has removed honeycomb dressing from her incision. Patient asks about showering. RN reviewed incision care with patient. Patient also asked about lifting. She has concern about lifing her toddler. RN told patient that it is not recommended to lift anything heavier than her baby at this time when she is healing. RN encouraged patient to talk with her OB about concerns. Patient has no other questions or concerns at this time. Do You Have Any Concerns About Your Infants Health?: No (Patient states that baby does not burp welll and has reflux. Patient reports that she has a pediatrician appointment on Thursday and plans to discuss concerns. Patient states baby is doing well.)  Edinburgh Postnatal Depression Scale:  In the Past 7 Days: I have been able to laugh and see the funny side of things.: Not quite so much now I have looked forward with enjoyment to things.: Rather less than I used to I have blamed myself unnecessarily when things went wrong.: No, never I have been anxious or worried for no good reason.: Yes, sometimes I have felt scared or panicky for no good reason.: Yes, sometimes Things have been getting on top of me.: Yes, sometimes I haven't been coping as well as usual I have been so unhappy that I have had difficulty sleeping.: Not at all I have felt sad or miserable.: Not very often I have been so unhappy that I have been crying.: Only occasionally The thought of harming myself has occurred to me.: Never [name] Postnatal Depression Scale Total: (!) 10  RN told patient about Maternal Mental Health  Resources ([facility], National Maternal Mental Health Hotline, Postpartum Support International, National Suicide and Crisis Lifeline). Also told patient about [facility] support group offerings. Will e-mail these resources to patient as well.  PHQ2-9 Depression Scale:     Discharge Follow-up: Edinburgh score requires follow up?: Yes Provider notified of Edinburgh score?: Yes (Fax notification sent to Dr. Juliene Pina at [facility]) Patient was advised of the following resources:: Breastfeeding Support Group, Support Group Patient referred to:: OB  Post-discharge interventions: Reviewed Newborn Safe Sleep Practices Maternal Mental Health Resources provided  Signature  Signe Colt

## 2024-05-12 ENCOUNTER — Ambulatory Visit: Payer: Self-pay | Admitting: Physical Therapy

## 2024-07-06 ENCOUNTER — Ambulatory Visit: Admitting: Physical Therapy
